# Patient Record
Sex: Male | Born: 1945 | Race: White | Hispanic: No | Marital: Single | Smoking: Never smoker
Health system: Southern US, Community
[De-identification: ages and names within clinical notes are randomized; demographics above are authoritative.]

## PROBLEM LIST (undated history)

## (undated) DIAGNOSIS — K047 Periapical abscess without sinus: Secondary | ICD-10-CM

## (undated) DIAGNOSIS — I4891 Unspecified atrial fibrillation: Secondary | ICD-10-CM

## (undated) DIAGNOSIS — A0472 Enterocolitis due to Clostridium difficile, not specified as recurrent: Secondary | ICD-10-CM

## (undated) DIAGNOSIS — I499 Cardiac arrhythmia, unspecified: Secondary | ICD-10-CM

## (undated) HISTORY — PX: CATARACT EXTRACTION W/ INTRAOCULAR LENS IMPLANT: SHX1309

---

## 1973-01-28 DIAGNOSIS — I499 Cardiac arrhythmia, unspecified: Secondary | ICD-10-CM

## 1973-01-28 HISTORY — DX: Cardiac arrhythmia, unspecified: I49.9

## 2015-08-10 ENCOUNTER — Emergency Department (HOSPITAL_COMMUNITY): Admission: EM | Admit: 2015-08-10 | Discharge: 2015-08-10 | Discharge status: Against Medical Advice | Payer: Self-pay

## 2015-08-11 ENCOUNTER — Ambulatory Visit (INDEPENDENT_AMBULATORY_CARE_PROVIDER_SITE_OTHER): Payer: Self-pay | Admitting: Physician Assistant

## 2015-08-11 VITALS — BP 122/72 | HR 77 | Temp 98.3°F | Resp 17 | Ht 64.5 in | Wt 143.0 lb

## 2015-08-11 DIAGNOSIS — K047 Periapical abscess without sinus: Secondary | ICD-10-CM

## 2015-08-11 MED ORDER — AMOXICILLIN-POT CLAVULANATE 875-125 MG PO TABS: 1.0000 | ORAL_TABLET | Freq: Two times a day (BID) | ORAL | Status: DC

## 2015-08-11 NOTE — Patient Instructions (Addendum)
IF you received an x-ray today, you will receive an invoice from [facility]. Please contact [facility] at [phone] with questions or concerns regarding your invoice.   IF you received labwork today, you will receive an invoice from [facility]. Please contact [facility] at [phone] with questions or concerns regarding your invoice.   Our billing staff will not be able to assist you with questions regarding bills from these companies.  You will be contacted with the lab results as soon as they are available. The fastest way to get your results is to activate your My Chart account. Instructions are located on the last page of this paperwork. If you have not heard from us regarding the results in 2 weeks, please contact this office.   Please contact the [facility] at [phone] for an appointment immediately.  They will advise you how to go about this at their clinic.  Ask for AnethAshley. Please take the antibiotic as prescribed. Use the warm compresses at least 4 times per day for 15 minutes. Dental Abscess A dental abscess is a collection of pus in or around a tooth. CAUSES This condition is caused by a bacterial infection around the root of the tooth that involves the inner part of the tooth (pulp). It may result from:  Severe tooth decay.  Trauma to the tooth that allows bacteria to enter into the pulp, such as a broken or chipped tooth.  Severe gum disease around a tooth. SYMPTOMS Symptoms of this condition include:  Severe pain in and around the infected tooth.  Swelling and redness around the infected tooth, in the mouth, or in the face.  Tenderness.  Pus drainage.  Bad breath.  Bitter taste in the mouth.  Difficulty swallowing.  Difficulty opening the mouth.  Nausea.  Vomiting.  Chills.  Swollen neck glands.  Fever. DIAGNOSIS This condition is diagnosed with examination of the infected tooth. During  the exam, your dentist may tap on the infected tooth. Your dentist will also ask about your medical and dental history and may order X-rays. TREATMENT This condition is treated by eliminating the infection. This may be done with:  Antibiotic medicine.  A root canal. This may be performed to save the tooth.  Pulling (extracting) the tooth. This may also involve draining the abscess. This is done if the tooth cannot be saved. HOME CARE INSTRUCTIONS  Take medicines only as directed by your dentist.  If you were prescribed antibiotic medicine, finish all of it even if you start to feel better.  Rinse your mouth (gargle) often with salt water to relieve pain or swelling.  Do not drive or operate heavy machinery while taking pain medicine.  Do not apply heat to the outside of your mouth.  Keep all follow-up visits as directed by your dentist. This is important. SEEK MEDICAL CARE IF:  Your pain is worse and is not helped by medicine. SEEK IMMEDIATE MEDICAL CARE IF:  You have a fever or chills.  Your symptoms suddenly get worse.  You have a very bad headache.  You have problems breathing or swallowing.  You have trouble opening your mouth.  You have swelling in your neck or around your eye.   This information is not intended to replace advice given to you by your health care provider. Make sure you discuss any questions you have with your health care provider.   Document Released: 01/14/2005 Document Revised: 05/31/2014 Document Reviewed: 01/11/2014 Elsevier Interactive Patient Education  2016 Elsevier Inc.

## 2015-08-11 NOTE — Progress Notes (Signed)
[facility] [address], [place] [id number] [phone]  Date:  08/11/2015   Name:  Brandon CoffinCharles W Ho   DOB:  1945/11/16   MRN:  [id number]  PCP:  Pcp Not In System    History of Present Illness:  Brandon Ho is a 70 y.o. male patient who [facility] cc dental pain and facial swelling.   Few days of left gum swelling and facial swelling.  No fever.  He has had 2 days of fatigue.  No abnormal tasting.  There is no pain with hot or cold drinks.     pcp is jerry davis.  He has no dental insurance, and can not find a dentist opened today.     There are no active problems to display for this patient.   No past medical history on file.  No past surgical history on file.  Social History  Substance Use Topics  . Smoking status: Never Smoker   . Smokeless tobacco: None  . Alcohol Use: None    No family history on file.  No Known Allergies  Medication list has been reviewed and updated.  No current outpatient prescriptions on file prior to visit.   No current facility-administered medications on file prior to visit.    ROS ROS otherwise unremarkable unless listed above.   Physical Examination: BP 122/72 mmHg  Pulse 77  Temp(Src) 98.3 F (36.8 C) (Oral)  Resp 17  Ht 5' 4.5" (1.638 m)  Wt 143 lb (64.864 kg)  BMI 24.18 kg/m2  SpO2 97% Ideal Body Weight: Weight in (lb) to have BMI = 25: 147.6  Physical Exam  Constitutional: He is oriented to person, place, and time. He appears well-developed and well-nourished. No distress.  HENT:  Head: Normocephalic and atraumatic.  Mouth/Throat: Normal dentition. Dental abscesses (at the left side of the upper incisor that extends posteriorly with swelling and tenderness.  no purulent fluid expressed.) present. No oropharyngeal exudate, posterior oropharyngeal edema or posterior oropharyngeal erythema.  Eyes: Conjunctivae and EOM are normal. Pupils are equal, round, and reactive to light.   Cardiovascular: Normal rate.   Pulmonary/Chest: Effort normal. No respiratory distress.  Neurological: He is alert and oriented to person, place, and time.  Skin: Skin is warm and dry. He is not diaphoretic.  Psychiatric: He has a normal mood and affect. His behavior is normal.    Assessment and Plan: Brandon Ho is a 70 y.o. male who is here today for swelling in mouth. Given augmentin. [facility] agreed to discuss pricing and see him today.  Advised alarming symptoms to warrant immediate return.   Abscessed tooth - Plan: DISCONTINUED: amoxicillin-clavulanate (AUGMENTIN) 875-125 MG tablet   Trena PlattStephanie English, PA-C [facility] 08/11/2015 10:15 AM

## 2015-08-21 ENCOUNTER — Ambulatory Visit (INDEPENDENT_AMBULATORY_CARE_PROVIDER_SITE_OTHER): Payer: Self-pay | Admitting: Physician Assistant

## 2015-08-21 VITALS — BP 118/74 | HR 71 | Temp 98.1°F | Ht 64.5 in | Wt 140.0 lb

## 2015-08-21 DIAGNOSIS — R030 Elevated blood-pressure reading, without diagnosis of hypertension: Secondary | ICD-10-CM

## 2015-08-21 DIAGNOSIS — IMO0001 Reserved for inherently not codable concepts without codable children: Secondary | ICD-10-CM

## 2015-08-21 DIAGNOSIS — R5383 Other fatigue: Secondary | ICD-10-CM

## 2015-08-21 DIAGNOSIS — K047 Periapical abscess without sinus: Secondary | ICD-10-CM

## 2015-08-21 MED ORDER — AMOXICILLIN 500 MG PO CAPS: 500.0000 mg | ORAL_CAPSULE | Freq: Three times a day (TID) | ORAL | 0 refills | Status: DC

## 2015-08-21 NOTE — Patient Instructions (Addendum)
Call [facility] and schedule another appt for extraction Continue amoxicillin 3 times a day until seen for extraction. Return here if your symptoms do not improve after procedure. F/u with your PCP    IF you received an x-ray today, you will receive an invoice from [facility]. Please contact [facility] at [phone] with questions or concerns regarding your invoice.   IF you received labwork today, you will receive an invoice from [facility]. Please contact [facility] at [phone] with questions or concerns regarding your invoice.   Our billing staff will not be able to assist you with questions regarding bills from these companies.  You will be contacted with the lab results as soon as they are available. The fastest way to get your results is to activate your My Chart account. Instructions are located on the last page of this paperwork. If you have not heard from [place] regarding the results in 2 weeks, please contact this office.

## 2015-08-21 NOTE — Progress Notes (Signed)
[facility] [address] [place] [address] [phone]  Date:  08/21/2015   Name:  Brandon Ho   DOB:  July 24, 1945   MRN:  [id number]  PCP:  Pcp Not In System    Chief Complaint: Follow-up (for gum abscess)   History of Present Illness:  This is a 70 y.o. male who is presenting for follow up dental abscess on upper left side. He was seen here 10 days ago on 7/14 by PA [place]. He was prescribed augmentin and instructed to contact the [facility] for an appt. He was also advised to use warm compresses several times a day. He was seen at [facility] after his appt here on the 14th. He was given an rx for amoxicillin 500 mg TID x 10 days instead of the augmentin. He took his last tab this AM. He went for an extraction 4 days ago on 7/20. They told him at that visit that his BP was too high for an extraction and that he would need to come back to see a PCP. Pt does have a PCP, Dr. Edmon Crape, but he is semi-retired and is hard to get in with him. He takes propranolol for fast heart rate. He states his BP has always been good. He did take his propranolol before his extraction appt. He reports the past few days he has not felt well. He is very fatigued and does not have much of an appetite. He noticed at one point that he had a sharp pain in his mid back. No coughing but he is worried he has pna.  Talked with supervisor at [employer] On the 20th his BP was 178/98 and then rechecked 15 minutes later and 172/90.   Denies hx CVA or MI.  Review of Systems:  Review of Systems See HPI  There are no active problems to display for this patient.   Prior to Admission medications   Medication Sig Start Date End Date Taking? Authorizing Provider  amoxicillin-clavulanate (AUGMENTIN) 875-125 MG tablet Take 1 tablet by mouth 2 (two) times daily. 08/11/15  Yes Collie Siad English, PA  propranolol (INDERAL) 40 MG tablet Take 40 mg by mouth 3 (three) times  daily.   Yes Historical Provider, MD    No Known Allergies  History reviewed. No pertinent surgical history.  Social History  Substance Use Topics  . Smoking status: Never Smoker  . Smokeless tobacco: Not on file  . Alcohol use Not on file    History reviewed. No pertinent family history.  Medication list has been reviewed and updated.  Physical Examination:  Physical Exam  Constitutional: He is oriented to person, place, and time. He appears well-developed and well-nourished. No distress.  HENT:  Head: Normocephalic and atraumatic.  Right Ear: Hearing, tympanic membrane, external ear and ear canal normal.  Left Ear: Hearing, tympanic membrane, external ear and ear canal normal.  Nose: Nose normal. Right sinus exhibits no maxillary sinus tenderness and no frontal sinus tenderness. Left sinus exhibits no maxillary sinus tenderness and no frontal sinus tenderness.  Mouth/Throat: Uvula is midline, oropharynx is clear and moist and mucous membranes are normal.  Left upper gingiva with broken dentition, superior abscess and mild fluctuance noted. TTP.  Eyes: Conjunctivae and lids are normal. Right eye exhibits no discharge. Left eye exhibits no discharge. No scleral icterus.  Cardiovascular: Normal rate, regular rhythm, normal heart sounds and normal pulses.   No murmur heard. Pulmonary/Chest: Effort normal and  breath sounds normal. No respiratory distress. He has no wheezes. He has no rhonchi. He has no rales.  Musculoskeletal: Normal range of motion.  Lymphadenopathy:       Head (right side): No submental, no submandibular and no tonsillar adenopathy present.       Head (left side): No submental, no submandibular and no tonsillar adenopathy present.    He has no cervical adenopathy.  Neurological: He is alert and oriented to person, place, and time.  Skin: Skin is warm, dry and intact. No lesion and no rash noted.  Psychiatric: He has a normal mood and affect. His speech is  normal and behavior is normal. Thought content normal.   BP 118/74   Pulse 71   Temp 98.1 F (36.7 C) (Oral)   Ht 5' 4.5" (1.638 m)   Wt 140 lb (63.5 kg)   BMI 23.66 kg/m   Assessment and Plan:  1. Dental abscess 2. Other fatigue 3. Elevated BP We have only seen pt twice here but both time BP has been normal. Suspect he was nervous for appt? I have written letter to dentist with his BP readings here. He will call to make another appt for extraction. Amoxcillin TID in the meantime since abscess still present and pt having malaise and fatigue. Return here if symptoms do not improve after extraction. - amoxicillin (AMOXIL) 500 MG capsule; Take 1 capsule (500 mg total) by mouth 3 (three) times daily.  Dispense: 30 capsule; Refill: 0   [place]well Miners. Dyke Brackett, MHS [facility]  08/21/2015

## 2015-08-30 ENCOUNTER — Ambulatory Visit (INDEPENDENT_AMBULATORY_CARE_PROVIDER_SITE_OTHER): Payer: Self-pay | Admitting: Urgent Care

## 2015-08-30 ENCOUNTER — Other Ambulatory Visit: Payer: Self-pay | Admitting: Urgent Care

## 2015-08-30 VITALS — BP 170/98 | HR 81 | Temp 97.5°F | Resp 16 | Ht 65.0 in | Wt 139.2 lb

## 2015-08-30 DIAGNOSIS — K047 Periapical abscess without sinus: Secondary | ICD-10-CM

## 2015-08-30 DIAGNOSIS — R51 Headache: Secondary | ICD-10-CM

## 2015-08-30 DIAGNOSIS — R519 Headache, unspecified: Secondary | ICD-10-CM

## 2015-08-30 DIAGNOSIS — K0889 Other specified disorders of teeth and supporting structures: Secondary | ICD-10-CM

## 2015-08-30 DIAGNOSIS — R030 Elevated blood-pressure reading, without diagnosis of hypertension: Secondary | ICD-10-CM

## 2015-08-30 DIAGNOSIS — R9431 Abnormal electrocardiogram [ECG] [EKG]: Secondary | ICD-10-CM

## 2015-08-30 LAB — BASIC METABOLIC PANEL
BUN: 10 mg/dL (ref 7–25)
CO2: 23 mmol/L (ref 20–31)
Calcium: 9.1 mg/dL (ref 8.6–10.3)
Chloride: 102 mmol/L (ref 98–110)
Creat: 1.31 mg/dL — ABNORMAL HIGH (ref 0.70–1.25)
Glucose, Bld: 111 mg/dL — ABNORMAL HIGH (ref 65–99)
POTASSIUM: 4.5 mmol/L (ref 3.5–5.3)
SODIUM: 136 mmol/L (ref 135–146)

## 2015-08-30 LAB — POCT CBC
GRANULOCYTE PERCENT: 67 % (ref 37–80)
HCT, POC: 40.9 % — AB (ref 43.5–53.7)
HEMOGLOBIN: 14.4 g/dL (ref 14.1–18.1)
Lymph, poc: 2.4 (ref 0.6–3.4)
MCH: 30.9 pg (ref 27–31.2)
MCHC: 35.2 g/dL (ref 31.8–35.4)
MCV: 87.7 fL (ref 80–97)
MID (CBC): 0.8 (ref 0–0.9)
MPV: 7.1 fL (ref 0–99.8)
PLATELET COUNT, POC: 200 10*3/uL (ref 142–424)
POC Granulocyte: 6.5 (ref 2–6.9)
POC LYMPH PERCENT: 24.8 %L (ref 10–50)
POC MID %: 8.2 %M (ref 0–12)
RBC: 4.66 M/uL — AB (ref 4.69–6.13)
RDW, POC: 13.1 %
WBC: 9.7 10*3/uL (ref 4.6–10.2)

## 2015-08-30 MED ORDER — HYDROCHLOROTHIAZIDE 12.5 MG PO TABS: 12.5000 mg | ORAL_TABLET | Freq: Every day | ORAL | 3 refills | Status: DC

## 2015-08-30 MED ORDER — CHLORHEXIDINE GLUCONATE 0.12 % MT SOLN: 15.0000 mL | Freq: Two times a day (BID) | OROMUCOSAL | 1 refills | Status: DC

## 2015-08-30 MED ORDER — AMOXICILLIN 500 MG PO CAPS: 500.0000 mg | ORAL_CAPSULE | Freq: Three times a day (TID) | ORAL | 0 refills | Status: DC

## 2015-08-30 NOTE — Patient Instructions (Addendum)
- For your headache, you may try Tylenol extra strength  every 6 hours. Please make sure you finish your antibiotics for your tooth infection. Check back with your dentist to make sure your abscess is resolved. In the meantime, use the oral mouth rinses together with your oral antibiotic. Start the low dose blood pressure medication. Hydrate very well with at least 2 liters of water daily.     Tension Headache A tension headache is a feeling of pain, pressure, or aching that is often felt over the front and sides of the head. The pain can be dull, or it can feel tight (constricting). Tension headaches are not normally associated with nausea or vomiting, and they do not get worse with physical activity. Tension headaches can last from 30 minutes to several days. This is the most common type of headache. CAUSES The exact cause of this condition is not known. Tension headaches often begin after stress, anxiety, or depression. Other triggers may include:  Alcohol.  Too much caffeine, or caffeine withdrawal.  Respiratory infections, such as colds, flu, or sinus infections.  Dental problems or teeth clenching.  Fatigue.  Holding your head and neck in the same position for a long period of time, such as while using a computer.  Smoking. SYMPTOMS Symptoms of this condition include:  A feeling of pressure around the head.  Dull, aching head pain.  Pain felt over the front and sides of the head.  Tenderness in the muscles of the head, neck, and shoulders. DIAGNOSIS This condition may be diagnosed based on your symptoms and a physical exam. Tests may be done, such as a CT scan or an MRI of your head. These tests may be done if your symptoms are severe or unusual. TREATMENT This condition may be treated with lifestyle changes and medicines to help relieve symptoms. HOME CARE INSTRUCTIONS Managing Pain  Take over-the-counter and prescription medicines only as told by your health care  provider.  Lie down in a dark, quiet room when you have a headache.  If directed, apply ice to the head and neck area:  Put ice in a plastic bag.  Place a towel between your skin and the bag.  Leave the ice on for 20 minutes, 2-3 times per day.  Use a heating pad or a hot shower to apply heat to the head and neck area as told by your health care provider. Eating and Drinking  Eat meals on a regular schedule.  Limit alcohol use.  Decrease your caffeine intake, or stop using caffeine. General Instructions  Keep all follow-up visits as told by your health care provider. This is important.  Keep a headache journal to help find out what may trigger your headaches. For example, write down:  What you eat and drink.  How much sleep you get.  Any change to your diet or medicines.  Try massage or other relaxation techniques.  Limit stress.  Sit up straight, and avoid tensing your muscles.  Do not use tobacco products, including cigarettes, chewing tobacco, or e-cigarettes. If you need help quitting, ask your health care provider.  Exercise regularly as told by your health care provider.  Get 7-9 hours of sleep, or the amount recommended by your health care provider. SEEK MEDICAL CARE IF:  Your symptoms are not helped by medicine.  You have a headache that is different from what you normally experience.  You have nausea or you vomit.  You have a fever. SEEK IMMEDIATE MEDICAL CARE IF:  Your headache becomes severe.  You have repeated vomiting.  You have a stiff neck.  You have a loss of vision.  You have problems with speech.  You have pain in your eye or ear.  You have muscular weakness or loss of muscle control.  You lose your balance or you have trouble walking.  You feel faint or you pass out.  You have confusion.   This information is not intended to replace advice given to you by your health care provider. Make sure you discuss any questions you  have with your health care provider.   Document Released: 01/14/2005 Document Revised: 10/05/2014 Document Reviewed: 05/09/2014 Elsevier Interactive Patient Education 2016 [employer].    IF you received an x-ray today, you will receive an invoice from [facility]. Please contact [facility] at [phone] with questions or concerns regarding your invoice.   IF you received labwork today, you will receive an invoice from [facility]. Please contact [facility] at [phone] with questions or concerns regarding your invoice.   Our billing staff will not be able to assist you with questions regarding bills from these companies.  You will be contacted with the lab results as soon as they are available. The fastest way to get your results is to activate your My Chart account. Instructions are located on the last page of this paperwork. If you have not heard from [place] regarding the results in 2 weeks, please contact this office.

## 2015-08-30 NOTE — Progress Notes (Signed)
MRN: [id number] DOB: 12-Jan-1946  Subjective:   Brandon Ho is a 70 y.o. male presenting for chief complaint of Headache  Reports 5 day history of intermittent headache. Has been frontal at times, also temporal/occipital headache, achy in nature, associated with tingling of his scalp, heart fluttering. Denies fever, blurred vision, confusion, chest pain, shob, abdominal pain, hematuria, lower leg swelling. Of note, had a tooth extraction on 08/22/2015, treated for tooth abscess, has been on amoxicillin for same 08/11/2015. He was initially sent back here prior to his tooth extraction for a recheck of his BP since it was elevated while at the dentist. His primary concern is ongoing tooth infection. He reports that he stopped taking amoxicillin yesterday because he was unsure if he should take it due to his headache. Continues to try and hydrate. Denies smoking cigarettes or drinking alcohol.   Brandon Ho has a current medication list which includes the following prescription(s): amoxicillin and propranolol. Also has No Known Allergies.  Brandon Ho  has no past medical history on file. Also  has no past surgical history on file.  Objective:   Vitals: BP (!) 170/98 (BP Location: Right Arm, Patient Position: Sitting, Cuff Size: Normal)   Pulse 81   Temp 97.5 F (36.4 C) (Oral)   Resp 16   Ht  (1.651 m)   Wt 139 lb 3.2 oz (63.1 kg)   SpO2 99%   BMI 23.16 kg/m   Physical Exam  Constitutional: He is oriented to person, place, and time. He appears well-developed and well-nourished.  HENT:  Tooth extraction of upper left jaw observed. There is erythema of surrounding tissue with associated tenderness but no drainage was expressed. He also has mild tenderness of his upper left face.  Eyes: No scleral icterus.  Cardiovascular: Normal rate, regular rhythm and intact distal pulses.  Exam reveals no gallop and no friction rub.   No murmur heard. Pulmonary/Chest: No respiratory distress. He  has no wheezes. He has no rales.  Musculoskeletal: He exhibits no edema.  Lymphadenopathy:    He has no cervical adenopathy.  Neurological: He is alert and oriented to person, place, and time.   Results for orders placed or performed in visit on 08/30/15 (from the past 24 hour(s))  POCT CBC     Status: Abnormal   Collection Time: 08/30/15 10:15 AM  Result Value Ref Range   WBC 9.7 4.6 - 10.2 K/uL   Lymph, poc 2.4 0.6 - 3.4   POC LYMPH PERCENT 24.8 10 - 50 %L   MID (cbc) 0.8 0 - 0.9   POC MID % 8.2 0 - 12 %M   POC Granulocyte 6.5 2 - 6.9   Granulocyte percent 67.0 37 - 80 %G   RBC 4.66 (A) 4.69 - 6.13 M/uL   Hemoglobin 14.4 14.1 - 18.1 g/dL   HCT, POC 30.8 (A) 65.7 - 53.7 %   MCV 87.7 80 - 97 fL   MCH, POC 30.9 27 - 31.2 pg   MCHC 35.2 31.8 - 35.4 g/dL   RDW, POC 84.6 %   Platelet Count, POC 200 142 - 424 K/uL   MPV 7.1 0 - 99.8 fL   ECG - Spiked T-waves in V3, V4. Otherwise in sinus rhythm.  Assessment and Plan :   1. Headache, unspecified headache type 2. Elevated blood pressure reading - Suspect tension headache, may be related to his blood pressure. However, this is only the first reading we have at our clinic. I advised  he use Inderal as he has in the past, start HCTZ low dose. Recheck in 4 weeks.  3. Tooth abscess 4. Tooth pain - Restart antibiotic, use chlorhexadine mouth rinse, check back with dentist.  5. Nonspecific abnormal electrocardiogram (ECG) (EKG) - BMet pending. Will monitor, counseled on symptoms of hyperkalemia. I do not suspect this is a source of his symptoms.  Brandon Bamberg, PA-C [facility] [phone] 08/30/2015 9:45 AM

## 2015-08-31 ENCOUNTER — Ambulatory Visit (INDEPENDENT_AMBULATORY_CARE_PROVIDER_SITE_OTHER): Payer: Self-pay | Admitting: Emergency Medicine

## 2015-08-31 ENCOUNTER — Telehealth: Payer: Self-pay

## 2015-08-31 ENCOUNTER — Ambulatory Visit (INDEPENDENT_AMBULATORY_CARE_PROVIDER_SITE_OTHER): Payer: Self-pay

## 2015-08-31 VITALS — BP 174/82 | HR 78 | Temp 98.0°F | Resp 18 | Ht 65.0 in | Wt 139.0 lb

## 2015-08-31 DIAGNOSIS — R51 Headache: Secondary | ICD-10-CM

## 2015-08-31 DIAGNOSIS — K047 Periapical abscess without sinus: Secondary | ICD-10-CM

## 2015-08-31 DIAGNOSIS — R110 Nausea: Secondary | ICD-10-CM

## 2015-08-31 DIAGNOSIS — I10 Essential (primary) hypertension: Secondary | ICD-10-CM

## 2015-08-31 DIAGNOSIS — R519 Headache, unspecified: Secondary | ICD-10-CM

## 2015-08-31 LAB — POCT CBC
GRANULOCYTE PERCENT: 68.7 % (ref 37–80)
HEMATOCRIT: 41.5 % — AB (ref 43.5–53.7)
Hemoglobin: 14.3 g/dL (ref 14.1–18.1)
LYMPH, POC: 2.2 (ref 0.6–3.4)
MCH, POC: 30.4 pg (ref 27–31.2)
MCHC: 34.4 g/dL (ref 31.8–35.4)
MCV: 88.3 fL (ref 80–97)
MID (CBC): 0.9 (ref 0–0.9)
MPV: 6.8 fL (ref 0–99.8)
PLATELET COUNT, POC: 214 10*3/uL (ref 142–424)
POC GRANULOCYTE: 6.7 (ref 2–6.9)
POC LYMPH %: 22.6 % (ref 10–50)
POC MID %: 8.7 %M (ref 0–12)
RBC: 4.7 M/uL (ref 4.69–6.13)
RDW, POC: 13.4 %
WBC: 9.8 10*3/uL (ref 4.6–10.2)

## 2015-08-31 LAB — POCT SEDIMENTATION RATE: POCT SED RATE: 26 mm/h — AB (ref 0–22)

## 2015-08-31 LAB — TROPONIN I: TROPONIN I: 0.01 ng/mL (ref <=0.05)

## 2015-08-31 MED ORDER — ONDANSETRON 4 MG PO TBDP: 4.0000 mg | ORAL_TABLET | Freq: Three times a day (TID) | ORAL | 0 refills | Status: DC | PRN

## 2015-08-31 MED ORDER — ONDANSETRON 4 MG PO TBDP
4.0000 mg | ORAL_TABLET | ORAL | Status: AC
  Administered 2015-08-31: 4 mg via ORAL

## 2015-08-31 NOTE — Patient Instructions (Addendum)
Please eat a soft diet and be sure you get in fluids. I sent in a prescription for Zofran to have for nausea. Decrease your amoxicillin  To  twice a day with food Nausea, Adult Nausea is the feeling that you have an upset stomach or have to vomit. Nausea by itself is not likely a serious concern, but it may be an early sign of more serious medical problems. As nausea gets worse, it can lead to vomiting. If vomiting develops, there is the risk of dehydration.  CAUSES   Viral infections.  Food poisoning.  Medicines.  Pregnancy.  Motion sickness.  Migraine headaches.  Emotional distress.  Severe pain from any source.  Alcohol intoxication. HOME CARE INSTRUCTIONS  Get plenty of rest.  Ask your caregiver about specific rehydration instructions.  Eat small amounts of food and sip liquids more often.  Take all medicines as told by your caregiver. SEEK MEDICAL CARE IF:  You have not improved after 2 days, or you get worse.  You have a headache. SEEK IMMEDIATE MEDICAL CARE IF:   You have a fever.  You faint.  You keep vomiting or have blood in your vomit.  You are extremely weak or dehydrated.  You have dark or bloody stools.  You have severe chest or abdominal pain. MAKE SURE YOU:  Understand these instructions.  Will watch your condition.  Will get help right away if you are not doing well or get worse.   This information is not intended to replace advice given to you by your health care provider. Make sure you discuss any questions you have with your health care provider.   Document Released: 02/22/2004 Document Revised: 02/04/2014 Document Reviewed: 09/26/2010 Elsevier Interactive Patient Education [employer] . Plan to see me on Saturday afternoon.    IF you received an x-ray today, you will receive an invoice from [facility]. Please contact [facility] at [phone] with questions or concerns regarding your invoice.   IF  you received labwork today, you will receive an invoice from [employer]. Please contact [facility] at [phone] with questions or concerns regarding your invoice.   Our billing staff will not be able to assist you with questions regarding bills from these companies.  You will be contacted with the lab results as soon as they are available. The fastest way to get your results is to activate your My Chart account. Instructions are located on the last page of this paperwork. If you have not heard from [place] regarding the results in 2 weeks, please contact this office.

## 2015-08-31 NOTE — Progress Notes (Addendum)
Patient ID: Brandon Ho, male   DOB: Jan 27, 1946, 70 y.o.   MRN: [id number]    By signing my name below I, Shelah Lewandowsky, attest that this documentation has been prepared under the direction and in the presence of [name], MD. Electonically Signed. Shelah Lewandowsky, Scribe 08/31/2015 at 10:56 AM  Chief Complaint:  Chief Complaint  Patient presents with  . Follow-up    TOOTH REMOVAL/ POSS INFECTION IN GUM/PATIENT CAN'T EAT  . Headache    BACK OF HEAD/ HAVING TROUBLE GETTING FOOD DOWN    HPI: Brandon Ho is a 70 y.o. male who reports to [facility] today complaining of worsening nausea for the past 4 days that has been so severe in the past 2 days he has not been able to eat any food for the past 2 days. Pt has been drinking plenty of fluids until last night and this morning. Pt also reports worsening chills, diaphoresis in extremities, weakness, and posterior HA for the past 6 days. HA is a 5/10 pain. Pt took tylenol last night and pt's nausea worsened. Pt denies any abd pain or diarrhea. Pt reports having an episode of chest tightness last night. Pt has had mild SOB. Pt denies any CP, even with exertion.   Pt reports that he was seen and evaluated at [facility] yesterday and had blood work and an EKG.  Pt reports that he had a tooth abscess. Pt was put on amoxicillin and had his tooth removed 9 days ago.  Takes inderal since 76 for fast irregular heartbeat that is well controlled with medication.     History reviewed. No pertinent past medical history. History reviewed. No pertinent surgical history. Social History   Social History  . Marital status: Single    Spouse name: N/A  . Number of children: N/A  . Years of education: N/A   Social History Main Topics  . Smoking status: Never Smoker  . Smokeless tobacco: Never Used  . Alcohol use No  . Drug use: No  . Sexual activity: Not Asked   Other Topics Concern  . None   Social History Narrative  . None   History reviewed. No  pertinent family history. No Known Allergies Prior to Admission medications   Medication Sig Start Date End Date Taking? Authorizing Provider  amoxicillin (AMOXIL) 500 MG capsule Take 1 capsule (500 mg total) by mouth 3 (three) times daily with meals. 08/30/15  Yes Wallis Bamberg, PA-C  chlorhexidine (PERIDEX) 0.12 % solution Use as directed 15 mLs in the mouth or throat 2 (two) times daily. 08/30/15  Yes Wallis Bamberg, PA-C  hydrochlorothiazide (HYDRODIURIL) 12.5 MG tablet Take 1 tablet (12.5 mg total) by mouth daily. 08/30/15  Yes Wallis Bamberg, PA-C  propranolol (INDERAL) 40 MG tablet Take 40 mg by mouth 3 (three) times daily.   Yes Historical Provider, MD     ROS: The patient denies unintentional weight loss, palpitations, wheezing, dyspnea on exertion, diarrhea, vomiting, abdominal pain, dysuria, hematuria, melena, numbness, or tingling. Pt is positive for chest tightness, chills, nausea, weakness, and HA.  All other systems have been reviewed and were otherwise negative with the exception of those mentioned in the HPI and as above.    PHYSICAL EXAM: Vitals:   08/31/15 0955  BP: 130/82  Pulse: 78  Resp: 18  Temp: 98 F (36.7 C)   Body mass index is 23.13 kg/m.   General: Alert, no acute distress HEENT:  Normocephalic, atraumatic, oropharynx patent. Pt has a dental [place]  on his left lower second premolar and a recent extraction of the left upper premolar. Mild tenderness at extraction site. No drainage.  Eye: Nonie Hoyer [facility] Cardiovascular:  Regular rate and rhythm, no rubs murmurs or gallops.  No Carotid bruits, radial pulse intact. No pedal edema. Pt has a loud second heart sound.  Respiratory: Clear to auscultation bilaterally.  No wheezes, rales, or rhonchi.  No cyanosis, no use of accessory musculature Abdominal: No organomegaly, abdomen is soft and non-tender, positive bowel sounds.  No masses. Musculoskeletal: Gait intact. No edema, tenderness Skin: No rashes. Neurologic: Facial  musculature symmetric. Psychiatric: Patient acts appropriately throughout our interaction. Lymphatic: No cervical or submandibular lymphadenopathy  Wt Readings from Last 3 Encounters:  08/31/15 139 lb (63 kg)  08/30/15 139 lb 3.2 oz (63.1 kg)  08/21/15 140 lb (63.5 kg)     LABS:  White count yesterday was within normal limits.   Results for orders placed or performed in visit on 08/31/15  POCT CBC  Result Value Ref Range   WBC 9.8 4.6 - 10.2 K/uL   Lymph, poc 2.2 0.6 - 3.4   POC LYMPH PERCENT 22.6 10 - 50 %L   MID (cbc) 0.9 0 - 0.9   POC MID % 8.7 0 - 12 %M   POC Granulocyte 6.7 2 - 6.9   Granulocyte percent 68.7 37 - 80 %G   RBC 4.70 4.69 - 6.13 M/uL   Hemoglobin 14.3 14.1 - 18.1 g/dL   HCT, POC 16.1 (A) 09.6 - 53.7 %   MCV 88.3 80 - 97 fL   MCH, POC 30.4 27 - 31.2 pg   MCHC 34.4 31.8 - 35.4 g/dL   RDW, POC 04.5 %   Platelet Count, POC 214 142 - 424 K/uL   MPV 6.8 0 - 99.8 fL     EKG/XRAY:   Primary read interpreted by Dr. Cleta Alberts at [facility].  EKG from yesterday viewed and interpreted by Dr Cleta Alberts and shows no acute changes.   EKG today shows more artifact than yesterday, no acute changes.   Dg Chest 2 View  Result Date: 08/31/2015 CLINICAL DATA:  Worsening nausea for the past 4 days with increasing severity over the past 2 days. No oral ingestion for 2 days. EXAM: CHEST  2 VIEW COMPARISON:  None in PACs FINDINGS: The lungs are mildly hyperinflated with hemidiaphragm flattening and increased AP dimension of the thorax. There is an azygos lobe anatomy on the right. There is no alveolar pneumonia. The heart and pulmonary vascularity are normal. There is calcification in the wall of the aortic arch. There is no pleural effusion or pneumothorax. The bony thorax exhibits no acute abnormality. The gas pattern in the upper abdomen is unremarkable. IMPRESSION: COPD.  No pneumonia nor CHF. Aortic atherosclerosis. Electronically Signed   By: David  [place] M.D.   On: 08/31/2015 10:34    Results for orders placed or performed in visit on 08/31/15  Troponin I  Result Value Ref Range   Troponin I 0.01 <=0.05 ng/mL  POCT CBC  Result Value Ref Range   WBC 9.8 4.6 - 10.2 K/uL   Lymph, poc 2.2 0.6 - 3.4   POC LYMPH PERCENT 22.6 10 - 50 %L   MID (cbc) 0.9 0 - 0.9   POC MID % 8.7 0 - 12 %M   POC Granulocyte 6.7 2 - 6.9   Granulocyte percent 68.7 37 - 80 %G   RBC 4.70 4.69 - 6.13 M/uL   Hemoglobin 14.3 14.1 -  18.1 g/dL   HCT, POC 82.8 (A) 00.3 - 53.7 %   MCV 88.3 80 - 97 fL   MCH, POC 30.4 27 - 31.2 pg   MCHC 34.4 31.8 - 35.4 g/dL   RDW, POC 49.1 %   Platelet Count, POC 214 142 - 424 K/uL   MPV 6.8 0 - 99.8 fL  POCT SEDIMENTATION RATE  Result Value Ref Range   POCT SED RATE 26 (A) 0 - 22 mm/hr    ASSESSMENT/PLAN:  It is not clear to me what is going on. He has been treated for dental  Infection and abscess. He has a persistent carious tooth in the lower jaw. He has complaints of nausea but no true chest pain. His EKG is unremarkable and troponin was ordered. His chest x-ray shows no pneumonia. We'll see about getting him a liter of fluids and some Zofran and see if that makes him feel better.atient has an appointment to follow-up with the dentist on Monday. I will also check on Saturday  if he is not  Starting to feel better.   Gross sideeffects, risk and benefits, and alternatives of medications d/w patient. Patient is aware that all medications have potential sideeffects and we are unable to predict every sideeffect or drug-drug interaction that may occur.  [name] MD 08/31/2015 10:00 AM

## 2015-08-31 NOTE — Telephone Encounter (Signed)
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 As he will pay without insurance  [phone]

## 2015-09-02 ENCOUNTER — Ambulatory Visit (INDEPENDENT_AMBULATORY_CARE_PROVIDER_SITE_OTHER): Payer: Self-pay

## 2015-09-02 ENCOUNTER — Ambulatory Visit (HOSPITAL_COMMUNITY)
Admission: RE | Admit: 2015-09-02 | Discharge: 2015-09-02 | Disposition: A | Discharge status: Home / Self Care | Payer: Self-pay | Source: Ambulatory Visit | Attending: Emergency Medicine | Admitting: Emergency Medicine

## 2015-09-02 ENCOUNTER — Ambulatory Visit (INDEPENDENT_AMBULATORY_CARE_PROVIDER_SITE_OTHER): Payer: Self-pay | Admitting: Emergency Medicine

## 2015-09-02 VITALS — BP 132/86 | HR 69 | Temp 98.2°F | Resp 16 | Ht 65.0 in | Wt 139.0 lb

## 2015-09-02 DIAGNOSIS — K0889 Other specified disorders of teeth and supporting structures: Secondary | ICD-10-CM

## 2015-09-02 DIAGNOSIS — R930 Abnormal findings on diagnostic imaging of skull and head, not elsewhere classified: Secondary | ICD-10-CM

## 2015-09-02 DIAGNOSIS — M542 Cervicalgia: Secondary | ICD-10-CM

## 2015-09-02 DIAGNOSIS — R519 Headache, unspecified: Secondary | ICD-10-CM

## 2015-09-02 DIAGNOSIS — I10 Essential (primary) hypertension: Secondary | ICD-10-CM

## 2015-09-02 DIAGNOSIS — R55 Syncope and collapse: Secondary | ICD-10-CM

## 2015-09-02 DIAGNOSIS — R51 Headache: Secondary | ICD-10-CM

## 2015-09-02 MED ORDER — ONDANSETRON 4 MG PO TBDP: 4.0000 mg | ORAL_TABLET | Freq: Three times a day (TID) | ORAL | 0 refills | Status: DC | PRN

## 2015-09-02 NOTE — Progress Notes (Signed)
By signing my name below, I, Brandon Ho, attest that this documentation has been prepared under the direction and in the presence of Brandon Chris, MD. Electronically Signed: Stann Ho, Scribe. 09/02/2015 , 10:45 AM .  Patient was seen in room 10 .  Chief Complaint:  Chief Complaint  Patient presents with  . Follow-up    nausea seen yesterday     HPI: Brandon Ho is a 70 y.o. male who reports to [facility] today for follow up nausea. Patient's sed rate was 26, wbc normal, troponin negative; BMP showed slightly elevated glucose at 111. Xray showed atherosclerosis; EKG showed sinus rhythm, no acute changes.   Patient is still having headaches but he has been feeling better intermittently but it doesn't last. He has his headache from when he wakes up, located from the top down to the middle. He felt extremely fatigue last night and this morning.   He mentions neck pain after losing his balance 2 days ago. He returned home after being seen here. He took a nap, and when he woke up, he was trying to put on his trousers but lost balance with slight syncope, falling to the floor hitting his left arm, left shoulder and the back of his left leg.   He also reports abdominal pain with loose stool once yesterday. He notes eating some bread prior yesterday.  He has eye surgery history done by Dr. Dione Ho.   He's been instructed to take his inderal tid, but he notes only been taking it bid.   He also states "possibly misusing mouth wash". He didn't rinse his mouth out with water after swishing the mouth wash, and his tongue continues to feel "tingly" with some burning in the back of his throat.   No past medical history on file. No past surgical history on file. Social History   Social History  . Marital status: Single    Spouse name: N/A  . Number of children: N/A  . Years of education: N/A   Social History Main Topics  . Smoking status: Never Smoker  . Smokeless tobacco: Never Used    . Alcohol use No  . Drug use: No  . Sexual activity: Not Asked   Other Topics Concern  . None   Social History Narrative  . None   No family history on file. No Known Allergies Prior to Admission medications   Medication Sig Start Date End Date Taking? Authorizing Provider  amoxicillin (AMOXIL) 500 MG capsule Take 1 capsule (500 mg total) by mouth 3 (three) times daily with meals. 08/30/15  Yes Brandon Bamberg, PA-C  chlorhexidine (PERIDEX) 0.12 % solution Use as directed 15 mLs in the mouth or throat 2 (two) times daily. 08/30/15  Yes Brandon Bamberg, PA-C  hydrochlorothiazide (HYDRODIURIL) 12.5 MG tablet Take 1 tablet (12.5 mg total) by mouth daily. 08/30/15  Yes Brandon Bamberg, PA-C  ondansetron (ZOFRAN ODT) 4 MG disintegrating tablet Take 1 tablet (4 mg total) by mouth every 8 (eight) hours as needed for nausea or vomiting. 08/31/15  Yes Brandon Gobble, MD  propranolol (INDERAL) 40 MG tablet Take 40 mg by mouth 3 (three) times daily.   Yes Historical Provider, MD     ROS:  Constitutional: negative for fever, chills, night sweats, weight changes, or fatigue  HEENT: negative for vision changes, hearing loss, congestion, rhinorrhea, ST, epistaxis, or sinus pressure Cardiovascular: negative for chest pain or palpitations Respiratory: negative for hemoptysis, wheezing, shortness of breath, or cough Abdominal: negative  for nausea, vomiting, diarrhea, or constipation; positive for abdominal pain, loose stools Dermatological: negative for rash Musc: positive for neck pain Neurologic: positive for headache, dizziness, syncope All other systems reviewed and are otherwise negative with the exception to those above and in the HPI.  PHYSICAL EXAM: Vitals:   09/02/15 1001  BP: 132/86  Pulse: 69  Resp: 16  Temp: 98.2 F (36.8 C)   Body mass index is 23.13 kg/m.   General: Alert, no acute distress HEENT:  Normocephalic, atraumatic, oropharynx patent. Eye: EOMI, PEERLDC; Bilateral cataract  surgery Cardiovascular:  Regular rate and rhythm, no rubs murmurs or gallops.  No Carotid bruits, radial pulse intact. No pedal edema.  Respiratory: Clear to auscultation bilaterally.  No wheezes, rales, or rhonchi.  No cyanosis, no use of accessory musculature Abdominal: No organomegaly, positive bowel sounds. no areas of tenderness, no masses Musculoskeletal: Gait intact. No edema, tenderness Skin: No rashes. Neurologic: Facial musculature symmetric. Psychiatric: Patient acts appropriately throughout our interaction.  Lymphatic: No cervical or submandibular lymphadenopathy Genitourinary/Anorectal: No acute findings  LABS:   EKG/XRAY:   Dg Chest 2 View  Result Date: 08/31/2015 CLINICAL DATA:  Worsening nausea for the past 4 days with increasing severity over the past 2 days. No oral ingestion for 2 days. EXAM: CHEST  2 VIEW COMPARISON:  None in PACs FINDINGS: The lungs are mildly hyperinflated with hemidiaphragm flattening and increased AP dimension of the thorax. There is an azygos lobe anatomy on the right. There is no alveolar pneumonia. The heart and pulmonary vascularity are normal. There is calcification in the wall of the aortic arch. There is no pleural effusion or pneumothorax. The bony thorax exhibits no acute abnormality. The gas pattern in the upper abdomen is unremarkable. IMPRESSION: COPD.  No pneumonia nor CHF. Aortic atherosclerosis. Electronically Signed   By: David  [place] M.D.   On: 08/31/2015 10:34   Dg Cervical Spine Complete  Result Date: 09/02/2015 CLINICAL DATA:  Head and neck pain.  Fall 2 days ago. EXAM: CERVICAL SPINE - COMPLETE 4+ VIEW COMPARISON:  None. FINDINGS: The pre odontoid space and prevertebral soft tissues are normal. No traumatic malalignment identified. No fractures are noted. Scattered degenerative changes. No significant neural foraminal narrowing. The lateral masses of C1 are not well assessed versus C2. Very limited views of the odontoid process. The  upper chest is unremarkable. IMPRESSION: 1. Limited views of the odontoid process. No fracture or malalignment seen within this limitation. Electronically Signed   By: Brandon Ho M.D   On: 09/02/2015 11:40    ASSESSMENT/PLAN: Still not clear to me what is going on. Troponin yesterday was negative. His sugar is only slightly elevated. He has minimal renal disease. C-spine shows significant arthritic change. We'll proceed wi with his headache and persistent nausea.I will have him stop his anitbiotic and see the dentist on Monday.no fractures or malalignment were seen on C-spine films though the odontoid i was poorly seen.we'll proceed with CT of the head. We'll also stop his antibiotics and continue Zofran for now.I personally performed the services described in this documentation, which was scribed in my presence. The recorded information has been reviewed and is accurate. CT of the head showed only small vessel disease no acute infarct or acute changes.he is going to stop his peridex. He will stop his antibiotics. He will follow-up with the dentist on Monday. He will continue his blood pressure medication. Gross sideeffects, risk and benefits, and alternatives of medications d/w patient. Patient is aware that  all medications have potential sideeffects and we are unable to predict every sideeffect or drug-drug interaction that may occur.  Brandon Chris MD 09/02/2015 10:06 AM

## 2015-09-02 NOTE — Patient Instructions (Addendum)
You are to go over to [facility] ER now for your scan.  Let them know that you are there for an outpatient CT scan.  Address: [address] [place], [place], [place] [address].  [phone]     IF you received an x-ray today, you will receive an invoice from [facility]. Please contact [facility] at [phone] with questions or concerns regarding your invoice.   IF you received labwork today, you will receive an invoice from [employer]. Please contact [facility] at [phone] with questions or concerns regarding your invoice.   Our billing staff will not be able to assist you with questions regarding bills from these companies.  You will be contacted with the lab results as soon as they are available. The fastest way to get your results is to activate your My Chart account. Instructions are located on the last page of this paperwork. If you have not heard from [place] regarding the results in 2 weeks, please contact this office.

## 2015-09-04 ENCOUNTER — Telehealth: Payer: Self-pay | Admitting: Emergency Medicine

## 2015-09-04 ENCOUNTER — Encounter (HOSPITAL_COMMUNITY): Payer: Self-pay | Admitting: Emergency Medicine

## 2015-09-04 ENCOUNTER — Inpatient Hospital Stay (HOSPITAL_COMMUNITY)
Admission: EM | Admit: 2015-09-04 | Discharge: 2015-09-07 | DRG: 640 | Disposition: A | Discharge status: Home / Self Care | Payer: Medicare Other | Attending: Internal Medicine | Admitting: Internal Medicine

## 2015-09-04 ENCOUNTER — Telehealth: Payer: Self-pay

## 2015-09-04 DIAGNOSIS — R338 Other retention of urine: Secondary | ICD-10-CM | POA: Diagnosis present

## 2015-09-04 DIAGNOSIS — E860 Dehydration: Secondary | ICD-10-CM | POA: Diagnosis present

## 2015-09-04 DIAGNOSIS — Z823 Family history of stroke: Secondary | ICD-10-CM

## 2015-09-04 DIAGNOSIS — E43 Unspecified severe protein-calorie malnutrition: Secondary | ICD-10-CM | POA: Diagnosis present

## 2015-09-04 DIAGNOSIS — R531 Weakness: Secondary | ICD-10-CM | POA: Diagnosis not present

## 2015-09-04 DIAGNOSIS — E871 Hypo-osmolality and hyponatremia: Secondary | ICD-10-CM | POA: Diagnosis not present

## 2015-09-04 DIAGNOSIS — E876 Hypokalemia: Secondary | ICD-10-CM | POA: Diagnosis present

## 2015-09-04 DIAGNOSIS — G319 Degenerative disease of nervous system, unspecified: Secondary | ICD-10-CM | POA: Diagnosis present

## 2015-09-04 DIAGNOSIS — K047 Periapical abscess without sinus: Secondary | ICD-10-CM | POA: Diagnosis present

## 2015-09-04 DIAGNOSIS — R110 Nausea: Secondary | ICD-10-CM

## 2015-09-04 DIAGNOSIS — R930 Abnormal findings on diagnostic imaging of skull and head, not elsewhere classified: Secondary | ICD-10-CM | POA: Diagnosis present

## 2015-09-04 DIAGNOSIS — K5900 Constipation, unspecified: Secondary | ICD-10-CM | POA: Diagnosis present

## 2015-09-04 DIAGNOSIS — N400 Enlarged prostate without lower urinary tract symptoms: Secondary | ICD-10-CM | POA: Diagnosis present

## 2015-09-04 DIAGNOSIS — Z8249 Family history of ischemic heart disease and other diseases of the circulatory system: Secondary | ICD-10-CM

## 2015-09-04 DIAGNOSIS — R51 Headache: Secondary | ICD-10-CM

## 2015-09-04 DIAGNOSIS — Z6823 Body mass index (BMI) 23.0-23.9, adult: Secondary | ICD-10-CM

## 2015-09-04 DIAGNOSIS — I10 Essential (primary) hypertension: Secondary | ICD-10-CM | POA: Diagnosis present

## 2015-09-04 DIAGNOSIS — N401 Enlarged prostate with lower urinary tract symptoms: Secondary | ICD-10-CM | POA: Diagnosis present

## 2015-09-04 DIAGNOSIS — R519 Headache, unspecified: Secondary | ICD-10-CM

## 2015-09-04 HISTORY — DX: Cardiac arrhythmia, unspecified: I49.9

## 2015-09-04 HISTORY — DX: Periapical abscess without sinus: K04.7

## 2015-09-04 LAB — CBC
HCT: 35.2 % — ABNORMAL LOW (ref 39.0–52.0)
Hemoglobin: 13.5 g/dL (ref 13.0–17.0)
MCH: 30.5 pg (ref 26.0–34.0)
MCHC: 38.4 g/dL — ABNORMAL HIGH (ref 30.0–36.0)
MCV: 79.6 fL (ref 78.0–100.0)
Platelets: 189 10*3/uL (ref 150–400)
RBC: 4.42 MIL/uL (ref 4.22–5.81)
RDW: 12.8 % (ref 11.5–15.5)
WBC: 10.8 10*3/uL — AB (ref 4.0–10.5)

## 2015-09-04 LAB — BASIC METABOLIC PANEL
Anion gap: 12 (ref 5–15)
BUN: 17 mg/dL (ref 6–20)
CHLORIDE: 82 mmol/L — AB (ref 101–111)
CO2: 20 mmol/L — AB (ref 22–32)
CREATININE: 1.18 mg/dL (ref 0.61–1.24)
Calcium: 8.5 mg/dL — ABNORMAL LOW (ref 8.9–10.3)
GFR calc Af Amer: >60 mL/min (ref >60)
GFR calc non Af Amer: >60 mL/min (ref >60)
GLUCOSE: 106 mg/dL — AB (ref 65–99)
Potassium: 3.7 mmol/L (ref 3.5–5.1)
SODIUM: 114 mmol/L — AB (ref 135–145)

## 2015-09-04 LAB — CBG MONITORING, ED: Glucose-Capillary: 117 mg/dL — ABNORMAL HIGH (ref 65–99)

## 2015-09-04 LAB — DIFFERENTIAL
BASOS ABS: 0 10*3/uL (ref 0.0–0.1)
BASOS PCT: 0 %
Eosinophils Absolute: 0 10*3/uL (ref 0.0–0.7)
Eosinophils Relative: 0 %
LYMPHS PCT: 19 %
Lymphs Abs: 2 10*3/uL (ref 0.7–4.0)
MONO ABS: 1.4 10*3/uL — AB (ref 0.1–1.0)
Monocytes Relative: 13 %
NEUTROS ABS: 7.4 10*3/uL (ref 1.7–7.7)
NEUTROS PCT: 69 %

## 2015-09-04 MED ORDER — SODIUM CHLORIDE 0.9 % IV SOLN: 1000.0000 mL | INTRAVENOUS | Status: DC

## 2015-09-04 MED ORDER — DIPHENHYDRAMINE HCL 50 MG/ML IJ SOLN
25.0000 mg | Freq: Once | INTRAMUSCULAR | Status: AC
  Administered 2015-09-05: 25 mg via INTRAVENOUS
  Filled 2015-09-04: qty 1

## 2015-09-04 MED ORDER — SODIUM CHLORIDE 0.9 % IV SOLN
1000.0000 mL | Freq: Once | INTRAVENOUS | Status: AC
  Administered 2015-09-05: 1000 mL via INTRAVENOUS

## 2015-09-04 MED ORDER — METOCLOPRAMIDE HCL 5 MG/ML IJ SOLN
10.0000 mg | Freq: Once | INTRAMUSCULAR | Status: AC
  Administered 2015-09-05: 10 mg via INTRAVENOUS
  Filled 2015-09-04: qty 2

## 2015-09-04 NOTE — Telephone Encounter (Signed)
Pt advised.

## 2015-09-04 NOTE — Telephone Encounter (Signed)
Call patient and have him call our office after 4:00 today and I can see him tomorrow morning to check his urine and other blood work.

## 2015-09-04 NOTE — ED Triage Notes (Signed)
Pt arrives via EMS from home. On Tuesday, started to have lack of appetite, weakness, darker color urine, and nauseated tonight. IV established en route. Intermittent headaches that resolve with lying flat.

## 2015-09-04 NOTE — ED Notes (Signed)
Bed: ZO10WA05 Expected date:  Expected time:  Means of arrival:  Comments: [age] M dizzy, weakness

## 2015-09-04 NOTE — Telephone Encounter (Signed)
Patient stated he forgot to add one more thing to his message. Patient stated he got dehydrated on Saturday.

## 2015-09-04 NOTE — ED Provider Notes (Signed)
[facility]-EMERGENCY DEPT Provider Note   CSN: [id number] Arrival date & time: 09/04/15  2213  By signing my name below, I, Brandon Ho, attest that this documentation has been prepared under the direction and in the presence of Brandon Booze, MD. Electronically Signed: Phillis Ho, ED Scribe. 09/04/15. 11:58 PM.  First Provider Contact:  None    History   Chief Complaint Chief Complaint  Patient presents with  . Weakness    The history is provided by the patient. No language interpreter was used.   HPI Comments: Brandon Ho is a 70 y.o. male brought in by EMS who presents to the Emergency Department complaining of gradually worsening weakness onset 6 days ago. Pt reports associated decreased appetite, dark colored urine, intermittent, gradually worsening headaches, chills, frequency, decreased urine output, constipation, abdominal pain, nausea and vomiting bilious material that began tonight. Pt reports headache relief with lying down. He currently rates his headache 6/10. Pt states that he had an extraction two weeks ago for an abscessed tooth. Pt recently completed a course of penicillin. He states that he has been seen at Urgent Care a few times in the past week for these symptoms. He has had an EKG, x-rays, and CT scan this week. He was told by the dentist that he still has an infection in his gums from the abscessed tooth. He last urinated at 1:30 PM today. He states that he has not had a BM since 09/01/15 and it was loose. Pt was given an IV en route. He states that he has been taking ibuprofen that was prescribed to him at Urgent Care but felt worse after. He was also prescribed a new antibiotic tonight but did not take it. He denies hematuria or diarrhea.   Past Medical History:  Diagnosis Date  . Irregular heart beat 1975  . Tooth abscess     Patient Active Problem List   Diagnosis Date Noted  . Abnormal CT scan of head 09/02/2015  . Essential hypertension 08/31/2015     Past Surgical History:  Procedure Laterality Date  . CATARACT EXTRACTION W/ INTRAOCULAR LENS IMPLANT Bilateral        Home Medications    Prior to Admission medications   Medication Sig Start Date End Date Taking? Authorizing Provider  hydrochlorothiazide (HYDRODIURIL) 12.5 MG tablet Take 1 tablet (12.5 mg total) by mouth daily. 08/30/15  Yes Wallis Bamberg, PA-C  ondansetron (ZOFRAN ODT) 4 MG disintegrating tablet Take 1 tablet (4 mg total) by mouth every 8 (eight) hours as needed for nausea or vomiting. 09/02/15  Yes Collene Gobble, MD  propranolol (INDERAL) 40 MG tablet Take 40 mg by mouth 2 (two) times daily.    Yes Historical Provider, MD  amoxicillin (AMOXIL) 500 MG capsule Take 1 capsule (500 mg total) by mouth 3 (three) times daily with meals. Patient not taking: Reported on 09/04/2015 08/30/15   Wallis Bamberg, PA-C  chlorhexidine (PERIDEX) 0.12 % solution Use as directed 15 mLs in the mouth or throat 2 (two) times daily. Patient not taking: Reported on 09/04/2015 08/30/15   Wallis Bamberg, PA-C    Family History No family history on file.  Social History Social History  Substance Use Topics  . Smoking status: Never Smoker  . Smokeless tobacco: Never Used  . Alcohol use No     Allergies   Mouth rinse [cetylpyridinium chloride]   Review of Systems Review of Systems  Constitutional: Positive for appetite change and chills.  Gastrointestinal: Positive for abdominal pain, constipation,  nausea and vomiting. Negative for diarrhea.  Genitourinary: Positive for decreased urine volume and frequency. Negative for hematuria.  Neurological: Positive for weakness and headaches.  All other systems reviewed and are negative.    Physical Exam Updated Vital Signs BP 182/86 (BP Location: Right Arm)   Pulse 71   Temp 98.3 F (36.8 C) (Oral)   Resp 16   SpO2 100%   Physical Exam  Constitutional: He is oriented to person, place, and time. He appears well-developed and well-nourished.   HENT:  Head: Normocephalic and atraumatic.  Tooth #13 has been extracted and underlying gingiva appears normal; generally poor dentition  Eyes: EOM are normal. Pupils are equal, round, and reactive to light.  Neck: Normal range of motion. Neck supple. No JVD present.  Cardiovascular: Normal rate, regular rhythm and normal heart sounds.   No murmur heard. Pulmonary/Chest: Effort normal and breath sounds normal. He has no wheezes. He has no rales. He exhibits no tenderness.  Abdominal: Soft. He exhibits no distension and no mass. There is no tenderness.  Musculoskeletal: Normal range of motion. He exhibits no edema.  Lymphadenopathy:    He has no cervical adenopathy.  Neurological: He is alert and oriented to person, place, and time. No cranial nerve deficit. He exhibits normal muscle tone. Coordination normal.  Skin: Skin is warm and dry. No rash noted.  Psychiatric: He has a normal mood and affect. His behavior is normal. Judgment and thought content normal.  Nursing note and vitals reviewed.    ED Treatments / Results  DIAGNOSTIC STUDIES: Oxygen Saturation is 100% on RA, normal by my interpretation.    COORDINATION OF CARE: 11:55 PM-Discussed treatment plan which includes labs with pt at bedside and pt agreed to plan.   Labs (all labs ordered are listed, but only abnormal results are displayed) Labs Reviewed  BASIC METABOLIC PANEL - Abnormal; Notable for the following:       Result Value   Sodium 114 (*)    Chloride 82 (*)    CO2 20 (*)    Glucose, Bld 106 (*)    Calcium 8.5 (*)    All other components within normal limits  CBC - Abnormal; Notable for the following:    WBC 10.8 (*)    HCT 35.2 (*)    MCHC 38.4 (*)    All other components within normal limits  HEPATIC FUNCTION PANEL - Abnormal; Notable for the following:    AST 63 (*)    Total Bilirubin 1.6 (*)    Indirect Bilirubin 1.2 (*)    All other components within normal limits  DIFFERENTIAL - Abnormal; Notable  for the following:    Monocytes Absolute 1.4 (*)    All other components within normal limits  CBG MONITORING, ED - Abnormal; Notable for the following:    Glucose-Capillary 117 (*)    All other components within normal limits  URINALYSIS, ROUTINE W REFLEX MICROSCOPIC (NOT [facility])  SODIUM, URINE, RANDOM  CREATININE, URINE, RANDOM    EKG  EKG Interpretation  Date/Time:  Monday September 04 2015 22:53:31 EDT Ventricular Rate:  73 PR Interval:    QRS Duration: 88 QT Interval:  444 QTC Calculation: 490 R Axis:   64 Text Interpretation:  Sinus rhythm Nonspecific T abnrm, anterolateral leads Borderline prolonged QT interval No old tracing to compare Confirmed by [facility]  MD, Broden Holt ([id number]) on 09/04/2015 11:48:50 PM       Procedures Procedures (including critical care time) CRITICAL CARE Performed by: Brandon BoozeGLICK,Tamika Nou  Total critical care time: 45 minutes Critical care time was exclusive of separately billable procedures and treating other patients. Critical care was necessary to treat or prevent imminent or life-threatening deterioration. Critical care was time spent personally by me on the following activities: development of treatment plan with patient and/or surrogate as well as nursing, discussions with consultants, evaluation of patient's response to treatment, examination of patient, obtaining history from patient or surrogate, ordering and performing treatments and interventions, ordering and review of laboratory studies, ordering and review of radiographic studies, pulse oximetry and re-evaluation of patient's condition.  Medications Ordered in ED Medications  0.9 %  sodium chloride infusion (not administered)    Followed by  0.9 %  sodium chloride infusion (1,000 mLs Intravenous New Bag/Given 09/05/15 0005)    Followed by  0.9 %  sodium chloride infusion (not administered)  metoCLOPramide (REGLAN) injection 10 mg (not administered)  diphenhydrAMINE (BENADRYL) injection 25 mg (not  administered)     Initial Impression / Assessment and Plan / ED Course  I have reviewed the triage vital signs and the nursing notes.  Pertinent labs & imaging results that were available during my care of the patient were reviewed by me and considered in my medical decision making (see chart for details).  Clinical Course    Patient with ongoing problems with nausea and headache. Old records are reviewed, and he had been followed at urgent care for these symptoms as well as dental infection following extraction of abscessed tooth. 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 Laboratory workup today shows severe hyponatremia with commensurate drop in serum right. 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 IV hydration is started with normal saline. Case is discussed with Dr.Niu of [facility] who agrees to admit the patient. In addition, he was given metoclopramide and diphenhydramine to help treat his headache.   Final Clinical Impressions(s) / ED Diagnoses   Final diagnoses:  Acute hyponatremia  Nausea  Headache, unspecified headache type  Headache  I personally performed the services described in this documentation, which was scribed in my presence. The recorded information has been reviewed and is accurate.     New Prescriptions New Prescriptions   No medications on file     Brandon Booze, MD 09/06/15 [phone]

## 2015-09-04 NOTE — Telephone Encounter (Signed)
Patient is having issues when he urinates. The volume is much less and the color is darker than it has been in the past several days. He is having some discomfort in his stomach area. Kind of a full feeling. Advised pt to come in to be seen, but he wants Dr. Cleta Albertsaub to call him back and advise him what he needs to do.

## 2015-09-04 NOTE — Telephone Encounter (Signed)
Was seen by dentist today.He has a persistent dental infection. Started on cleocin. Nausea with vomiting. Decreased urination. No BM with minimal gas. [facility] and [facility] and consideration for scanning.

## 2015-09-04 NOTE — Telephone Encounter (Signed)
I called back to check on patient. He was week and dizzty I advised him to call 911.

## 2015-09-05 ENCOUNTER — Observation Stay (HOSPITAL_COMMUNITY): Payer: Medicare Other

## 2015-09-05 ENCOUNTER — Encounter (HOSPITAL_COMMUNITY): Payer: Self-pay | Admitting: *Deleted

## 2015-09-05 ENCOUNTER — Ambulatory Visit: Payer: Self-pay

## 2015-09-05 DIAGNOSIS — E876 Hypokalemia: Secondary | ICD-10-CM | POA: Diagnosis present

## 2015-09-05 DIAGNOSIS — R531 Weakness: Secondary | ICD-10-CM | POA: Diagnosis present

## 2015-09-05 DIAGNOSIS — E871 Hypo-osmolality and hyponatremia: Secondary | ICD-10-CM | POA: Diagnosis present

## 2015-09-05 DIAGNOSIS — R51 Headache: Secondary | ICD-10-CM

## 2015-09-05 DIAGNOSIS — Z8249 Family history of ischemic heart disease and other diseases of the circulatory system: Secondary | ICD-10-CM | POA: Diagnosis not present

## 2015-09-05 DIAGNOSIS — R519 Headache, unspecified: Secondary | ICD-10-CM | POA: Diagnosis present

## 2015-09-05 DIAGNOSIS — K047 Periapical abscess without sinus: Secondary | ICD-10-CM | POA: Diagnosis present

## 2015-09-05 DIAGNOSIS — Z6823 Body mass index (BMI) 23.0-23.9, adult: Secondary | ICD-10-CM | POA: Diagnosis not present

## 2015-09-05 DIAGNOSIS — E860 Dehydration: Secondary | ICD-10-CM | POA: Diagnosis present

## 2015-09-05 DIAGNOSIS — Z823 Family history of stroke: Secondary | ICD-10-CM | POA: Diagnosis not present

## 2015-09-05 DIAGNOSIS — E43 Unspecified severe protein-calorie malnutrition: Secondary | ICD-10-CM | POA: Diagnosis present

## 2015-09-05 DIAGNOSIS — R338 Other retention of urine: Secondary | ICD-10-CM | POA: Diagnosis present

## 2015-09-05 DIAGNOSIS — N401 Enlarged prostate with lower urinary tract symptoms: Secondary | ICD-10-CM | POA: Diagnosis present

## 2015-09-05 DIAGNOSIS — I10 Essential (primary) hypertension: Secondary | ICD-10-CM | POA: Diagnosis present

## 2015-09-05 DIAGNOSIS — G319 Degenerative disease of nervous system, unspecified: Secondary | ICD-10-CM | POA: Diagnosis present

## 2015-09-05 DIAGNOSIS — K5900 Constipation, unspecified: Secondary | ICD-10-CM | POA: Diagnosis present

## 2015-09-05 LAB — CBC
HEMATOCRIT: 33.4 % — AB (ref 39.0–52.0)
HEMOGLOBIN: 12.6 g/dL — AB (ref 13.0–17.0)
MCH: 30.5 pg (ref 26.0–34.0)
MCHC: 37.7 g/dL — AB (ref 30.0–36.0)
MCV: 80.9 fL (ref 78.0–100.0)
Platelets: 188 10*3/uL (ref 150–400)
RBC: 4.13 MIL/uL — AB (ref 4.22–5.81)
RDW: 12.9 % (ref 11.5–15.5)
WBC: 9.8 10*3/uL (ref 4.0–10.5)

## 2015-09-05 LAB — BASIC METABOLIC PANEL
Anion gap: 10 (ref 5–15)
Anion gap: 9 (ref 5–15)
Anion gap: 8 (ref 5–15)
BUN: 15 mg/dL (ref 6–20)
BUN: 12 mg/dL (ref 6–20)
BUN: 11 mg/dL (ref 6–20)
CALCIUM: 7.6 mg/dL — AB (ref 8.9–10.3)
CALCIUM: 7.9 mg/dL — AB (ref 8.9–10.3)
CHLORIDE: 90 mmol/L — AB (ref 101–111)
CHLORIDE: 93 mmol/L — AB (ref 101–111)
CHLORIDE: 96 mmol/L — AB (ref 101–111)
CO2: 20 mmol/L — ABNORMAL LOW (ref 22–32)
CO2: 22 mmol/L (ref 22–32)
CO2: 23 mmol/L (ref 22–32)
CREATININE: 1.09 mg/dL (ref 0.61–1.24)
CREATININE: 1.12 mg/dL (ref 0.61–1.24)
CREATININE: 1.15 mg/dL (ref 0.61–1.24)
Calcium: 8.3 mg/dL — ABNORMAL LOW (ref 8.9–10.3)
GFR calc Af Amer: >60 mL/min (ref >60)
GFR calc Af Amer: >60 mL/min (ref >60)
GFR calc Af Amer: >60 mL/min (ref >60)
GFR calc non Af Amer: >60 mL/min (ref >60)
GFR calc non Af Amer: >60 mL/min (ref >60)
Glucose, Bld: 104 mg/dL — ABNORMAL HIGH (ref 65–99)
Glucose, Bld: 103 mg/dL — ABNORMAL HIGH (ref 65–99)
Glucose, Bld: 112 mg/dL — ABNORMAL HIGH (ref 65–99)
Potassium: 2.8 mmol/L — ABNORMAL LOW (ref 3.5–5.1)
Potassium: 2.7 mmol/L — CL (ref 3.5–5.1)
Potassium: 3.1 mmol/L — ABNORMAL LOW (ref 3.5–5.1)
SODIUM: 120 mmol/L — AB (ref 135–145)
SODIUM: 124 mmol/L — AB (ref 135–145)
SODIUM: 127 mmol/L — AB (ref 135–145)

## 2015-09-05 LAB — OSMOLALITY, URINE: Osmolality, Ur: 159 mOsm/kg — ABNORMAL LOW (ref 300–900)

## 2015-09-05 LAB — URINE MICROSCOPIC-ADD ON

## 2015-09-05 LAB — URINALYSIS, ROUTINE W REFLEX MICROSCOPIC
BILIRUBIN URINE: NEGATIVE
GLUCOSE, UA: NEGATIVE mg/dL
Ketones, ur: 15 mg/dL — AB
Leukocytes, UA: NEGATIVE
Nitrite: NEGATIVE
PH: 6.5 (ref 5.0–8.0)
PROTEIN: NEGATIVE mg/dL
SPECIFIC GRAVITY, URINE: 1.006 (ref 1.005–1.030)

## 2015-09-05 LAB — HEPATIC FUNCTION PANEL
ALBUMIN: 4.1 g/dL (ref 3.5–5.0)
ALT: 37 U/L (ref 17–63)
AST: 63 U/L — ABNORMAL HIGH (ref 15–41)
Alkaline Phosphatase: 59 U/L (ref 38–126)
BILIRUBIN INDIRECT: 1.2 mg/dL — AB (ref 0.3–0.9)
Bilirubin, Direct: 0.4 mg/dL (ref 0.1–0.5)
TOTAL PROTEIN: 7.6 g/dL (ref 6.5–8.1)
Total Bilirubin: 1.6 mg/dL — ABNORMAL HIGH (ref 0.3–1.2)

## 2015-09-05 LAB — OSMOLALITY: OSMOLALITY: 249 mosm/kg — AB (ref 275–295)

## 2015-09-05 LAB — T4, FREE: Free T4: 1.73 ng/dL — ABNORMAL HIGH (ref 0.61–1.12)

## 2015-09-05 LAB — TSH: TSH: 6.195 u[IU]/mL — AB (ref 0.350–4.500)

## 2015-09-05 LAB — CREATININE, URINE, RANDOM: CREATININE, URINE: 30.09 mg/dL

## 2015-09-05 LAB — SODIUM, URINE, RANDOM: SODIUM UR: 24 mmol/L

## 2015-09-05 MED ORDER — POTASSIUM CHLORIDE CRYS ER 20 MEQ PO TBCR
40.0000 meq | EXTENDED_RELEASE_TABLET | Freq: Once | ORAL | Status: AC
  Administered 2015-09-05: 40 meq via ORAL
  Filled 2015-09-05: qty 2

## 2015-09-05 MED ORDER — SODIUM CHLORIDE 0.9% FLUSH
3.0000 mL | Freq: Two times a day (BID) | INTRAVENOUS | Status: DC
  Administered 2015-09-05 – 2015-09-06 (×3): 3 mL via INTRAVENOUS

## 2015-09-05 MED ORDER — SODIUM CHLORIDE 0.9 % IV SOLN
1000.0000 mL | INTRAVENOUS | Status: DC
  Administered 2015-09-05: 1000 mL via INTRAVENOUS

## 2015-09-05 MED ORDER — ACETAMINOPHEN 650 MG RE SUPP: 650.0000 mg | Freq: Four times a day (QID) | RECTAL | Status: DC | PRN

## 2015-09-05 MED ORDER — AMLODIPINE BESYLATE 5 MG PO TABS
5.0000 mg | ORAL_TABLET | Freq: Every day | ORAL | Status: DC
  Administered 2015-09-06 – 2015-09-07 (×2): 5 mg via ORAL
  Filled 2015-09-05 (×3): qty 1

## 2015-09-05 MED ORDER — HYDRALAZINE HCL 20 MG/ML IJ SOLN
5.0000 mg | INTRAMUSCULAR | Status: DC | PRN
  Administered 2015-09-05: 5 mg via INTRAVENOUS
  Filled 2015-09-05: qty 1

## 2015-09-05 MED ORDER — PROPRANOLOL HCL 40 MG PO TABS
40.0000 mg | ORAL_TABLET | Freq: Two times a day (BID) | ORAL | Status: DC
  Administered 2015-09-05 – 2015-09-07 (×5): 40 mg via ORAL
  Filled 2015-09-05 (×6): qty 1

## 2015-09-05 MED ORDER — ACETAMINOPHEN 325 MG PO TABS
650.0000 mg | ORAL_TABLET | Freq: Four times a day (QID) | ORAL | Status: DC | PRN
  Filled 2015-09-05: qty 2

## 2015-09-05 MED ORDER — ENOXAPARIN SODIUM 40 MG/0.4ML ~~LOC~~ SOLN
40.0000 mg | SUBCUTANEOUS | Status: DC
Start: 2015-09-05 — End: 2015-09-07
  Administered 2015-09-05 – 2015-09-07 (×3): 40 mg via SUBCUTANEOUS
  Filled 2015-09-05 (×3): qty 0.4

## 2015-09-05 MED ORDER — ONDANSETRON 4 MG PO TBDP: 4.0000 mg | ORAL_TABLET | Freq: Three times a day (TID) | ORAL | Status: DC | PRN

## 2015-09-05 NOTE — Progress Notes (Signed)
MD notified of K+ 2.7. SRP, RN

## 2015-09-05 NOTE — Care Management Obs Status (Signed)
MEDICARE OBSERVATION STATUS NOTIFICATION   Patient Details  Name: Brandon CoffinCharles W Labrador MRN: [id number] Date of Birth: Nov 04, 1945   Medicare Observation Status Notification Given:  Yes    Geni BersMcGibboney, Anella Nakata, RN 09/05/2015, 2:46 PM

## 2015-09-05 NOTE — Progress Notes (Signed)
Patient ID: Brandon Ho, male   DOB: August 26, 1945, 70 y.o.   MRN: [id number]  PROGRESS NOTE    Brandon Ho [facility]  NWG:[id number] DOB: 02-13-1945 DOA: 09/04/2015  PCP: Lucilla Edin, MD   Brief Narrative:   Pt admitted after midnight, please refer to admission note done 09/05/15 for details. 70 y.o. male with past medical history significant for hypertension, recent tooth extraction two weeks PTA for tooth abscess who presented with generalized weakness and headache for past 6 days PTA. He was seen in ED and had CT-head on 09/02/15 which showed nonspecific patchy areas of low density within the white matter bilaterally. He still has headache, constant, 6/10 in intensity, non radiating, alleviated by laying down. Patient reported generalized weakness. Pt was found to have sodium of 114 on admission, started on IV fluids with Q 6 hour sodium checks.  Assessment & Plan:   Principal Problem:   Hyponatremia, hypo-osmolar  - Serum osmolarity 249, urine sodium 24 and urine osmolarity 156 - TSH 6.195 - Based on above values, likely due to possible hypothyroidism - Dehydration less likely as urine sodium is typically less than 20 with dehydration.however he is responding nicely to IV fluids  - Primary polydipsia PP) also less likely as urine osm typically less than 100 in PP - SIADH less likely as no evidence of acute malignancy  - Will check fT4 - Continue IV fluids  - Check BMP in am   DVT prophylaxis: Lovenox suBQ Code Status: full code  Family Communication: no family at the bedside this am Disposition Plan: home once sodium closer to normal range    Consultants:   PT  Procedures:   None   Antimicrobials:   None    Subjective: No overnight events.   Objective: Vitals:   09/05/15 0442 09/05/15 0658 09/05/15 1248 09/05/15 1311  BP: (!) 175/90 138/71  (!) 156/73  Pulse: 91   82  Resp: 16   18  Temp: 97.6 F (36.4 C)  97.1 F (36.2 C) 98 F (36.7 C)  TempSrc: Oral   Oral    SpO2: 100%   99%  Weight:      Height:        Intake/Output Summary (Last 24 hours) at 09/05/15 1527 Last data filed at 09/05/15 1404  Gross per 24 hour  Intake                0 ml  Output             3675 ml  Net            -3675 ml   Filed Weights   09/05/15 0140  Weight: 63.5 kg (140 lb)    Examination:  General exam: Appears calm and comfortable  Respiratory system: Clear to auscultation. Respiratory effort normal. Cardiovascular system: S1 & S2 heard, RRR. No JVD, murmurs, rubs, gallops or clicks. No pedal edema. Gastrointestinal system: Abdomen is nondistended, soft and nontender.  Central nervous system: Alert and oriented. No focal neurological deficits. Extremities: Symmetric 5 x 5 power. Skin: No rashes, lesions or ulcers Psychiatry: Judgement and insight appear normal. Mood & affect appropriate.   Data Reviewed: I have personally reviewed following labs and imaging studies  CBC:  Recent Labs Lab 08/30/15 1015 08/31/15 1040 09/04/15 2300 09/05/15 0159  WBC 9.7 9.8 10.8* 9.8  NEUTROABS  --   --  7.4  --   HGB 14.4 14.3 13.5 12.6*  HCT 40.9* 41.5* 35.2* 33.4*  MCV  87.7 88.3 79.6 80.9  PLT  --   --  189 188   Basic Metabolic Panel:  Recent Labs Lab 08/30/15 1023 09/04/15 2300 09/05/15 0159 09/05/15 0734 09/05/15 1338  NA 136 114* 120* 124* 127*  K 4.5 3.7 2.8* 2.7* 3.1*  CL 102 82* 90* 93* 96*  CO2 23 20* 20* 22 23  GLUCOSE 111* 106* 104* 103* 112*  BUN 10 17 15 12 11   CREATININE 1.31* 1.18 1.09 1.12 1.15  CALCIUM 9.1 8.5* 7.6* 7.9* 8.3*   GFR: Estimated Creatinine Clearance: 52.7 mL/min (by C-G formula based on SCr of 1.15 mg/dL). Liver Function Tests:  Recent Labs Lab 09/04/15 2300  AST 63*  ALT 37  ALKPHOS 59  BILITOT 1.6*  PROT 7.6  ALBUMIN 4.1   No results for input(s): LIPASE, AMYLASE in the last 168 hours. No results for input(s): AMMONIA in the last 168 hours. Coagulation Profile: No results for input(s): INR, PROTIME in  the last 168 hours. Cardiac Enzymes:  Recent Labs Lab 08/31/15 1031  TROPONINI 0.01   BNP (last 3 results) No results for input(s): PROBNP in the last 8760 hours. HbA1C: No results for input(s): HGBA1C in the last 72 hours. CBG:  Recent Labs Lab 09/04/15 2310  GLUCAP 117*   Lipid Profile: No results for input(s): CHOL, HDL, LDLCALC, TRIG, CHOLHDL, LDLDIRECT in the last 72 hours. Thyroid Function Tests:  Recent Labs  09/05/15 0159  TSH 6.195*   Anemia Panel: No results for input(s): VITAMINB12, FOLATE, FERRITIN, TIBC, IRON, RETICCTPCT in the last 72 hours. Urine analysis:    Component Value Date/Time   COLORURINE YELLOW 09/05/2015 0443   APPEARANCEUR CLEAR 09/05/2015 0443   LABSPEC 1.006 09/05/2015 0443   PHURINE 6.5 09/05/2015 0443   GLUCOSEU NEGATIVE 09/05/2015 0443   HGBUR TRACE (A) 09/05/2015 0443   BILIRUBINUR NEGATIVE 09/05/2015 0443   KETONESUR 15 (A) 09/05/2015 0443   PROTEINUR NEGATIVE 09/05/2015 0443   NITRITE NEGATIVE 09/05/2015 0443   LEUKOCYTESUR NEGATIVE 09/05/2015 0443   Sepsis Labs: @LABRCNTIP (procalcitonin:4,lacticidven:4)   )No results found for this or any previous visit (from the past 240 hour(s)).    Radiology Studies: Dg Cervical Spine Complete  09/02/2015 1. Limited views of the odontoid process. No fracture or malalignment seen within this limitation. Electronically Signed   By: Gerome Samavid  Williams III M.D   On: 09/02/2015 11:40   Ct Head Wo Contrast 09/02/2015 1. Patchy areas of low density within the white matter bilaterally, nonspecific, likely related to chronic small vessel ischemia. 2. No acute findings.  No intracranial mass or hemorrhage. Electronically Signed   By: Bary RichardStan  Maynard M.D.   On: 09/02/2015 13:00    Scheduled Meds: . amLODipine  5 mg Oral Daily  . enoxaparin (LOVENOX) injection  40 mg Subcutaneous Q24H  . propranolol  40 mg Oral BID  . sodium chloride flush  3 mL Intravenous Q12H   Continuous Infusions:    LOS: 0  days    Time spent: 25 minutes No charge: pt admitted after midnight  Greater than 50% of the time spent on counseling and coordinating the care.   Manson PasseyEVINE, ALMA, MD [facility] Pager [phone]  If 7PM-7AM, please contact night-coverage [email or web address] Password [email or web address] 09/05/2015, 3:27 PM

## 2015-09-05 NOTE — Telephone Encounter (Signed)
See previous message. EMS called and they will transport to the hospital.

## 2015-09-05 NOTE — Progress Notes (Signed)
Upon pt arrival to floor; he was unable to urinate. Pt stated that he "had not voided in 12 hours." Did a bladder scan that showed >91499ml. Got an order to do an in and out catheter. Pt wanted to try to void one last time before the in and out catheter and ended up voiding 900 ml. Pt is having a lot of difficulty starting and it almost seems like he's having to push it out in little bits. It took him 20 minutes to void the amount above. Did another bladder scan which showed 325ml still left. Will hold off on the in and out catheter for right now and will continue to monitor patient. Rockne Coonsorcoran, Kimbely Whiteaker C, RN 2:39 AM 09/05/15

## 2015-09-05 NOTE — Progress Notes (Signed)
Pt voided 650ml; did a post-void bladder scan and it still showed around 350ml. Continuing to monitor patient's output. Rockne Coonsorcoran, Yehoshua Vitelli C, RN 4:42 AM 09/05/15

## 2015-09-05 NOTE — Telephone Encounter (Signed)
See previous message

## 2015-09-05 NOTE — Evaluation (Signed)
Physical Therapy One Time Evaluation Patient Details Name: Brandon CoffinCharles W Dewolfe MRN: [id number] DOB: 1945/05/20 Today's Date: 09/05/2015   History of Present Illness  70 y.o. male with medical history significant of hypertension, history of irregular heartbeat, recent tooth extraction two weeks ago for an abscessed tooth, who presents with generalized weakness and headache  Clinical Impression  Patient evaluated by Physical Therapy with no further acute PT needs identified. All education has been completed and the patient has no further questions. Pt mobilizing well and only c/o generalized weakness however has not been eating as much and feeling poorly since tooth extraction a couple weeks ago.  See below for any follow-up Physical Therapy or equipment needs. PT is signing off. Thank you for this referral.     Follow Up Recommendations No PT follow up    Equipment Recommendations  None recommended by PT    Recommendations for Other Services       Precautions / Restrictions Precautions Precautions: None      Mobility  Bed Mobility Overal bed mobility: Modified Independent                Transfers   Equipment used: None Transfers: Sit to/from Stand Sit to Stand: Supervision            Ambulation/Gait Ambulation/Gait assistance: Min guard;Supervision Ambulation Distance (Feet): 300 Feet Assistive device: None Gait Pattern/deviations: Decreased stride length     General Gait Details: steady, no LOB observed, pt reports generalized weakness, mild R sided headache  Stairs            Wheelchair Mobility    Modified Rankin (Stroke Patients Only)       Balance Overall balance assessment: History of Falls                                           Pertinent Vitals/Pain Pain Assessment: No/denies pain    Home Living Family/patient expects to be discharged to:: Private residence Living Arrangements: Alone   Type of Home: House Home  Access: Stairs to enter Entrance Stairs-Rails: Right Entrance Stairs-Number of Steps: " a few" Home Layout: One level Home Equipment: Cane - single point      Prior Function Level of Independence: Independent               Hand Dominance        Extremity/Trunk Assessment               Lower Extremity Assessment: Generalized weakness      Cervical / Trunk Assessment: Kyphotic  Communication   Communication: No difficulties  Cognition Arousal/Alertness: Awake/alert Behavior During Therapy: WFL for tasks assessed/performed Overall Cognitive Status: Within Functional Limits for tasks assessed                      General Comments General comments (skin integrity, edema, etc.): Pt reports fall about a week ago when trying to put on trousers.  Pt was standing and states that was a poor idea.    Exercises        Assessment/Plan    PT Assessment Patent does not need any further PT services  PT Diagnosis Difficulty walking   PT Problem List    PT Treatment Interventions     PT Goals (Current goals can be found in the Care Plan section) Acute Rehab PT Goals PT Goal Formulation:  All assessment and education complete, DC therapy    Frequency     Barriers to discharge        Co-evaluation               End of Session   Activity Tolerance: Patient tolerated treatment well Patient left: in chair;with call bell/phone within reach;with chair alarm set Nurse Communication: Mobility status    Functional Assessment Tool Used: clinical judgement Functional Limitation: Mobility: Walking and moving around Mobility: Walking and Moving Around Current Status [phone]): 0 percent impaired, limited or restricted Mobility: Walking and Moving Around Goal Status [phone]): 0 percent impaired, limited or restricted Mobility: Walking and Moving Around Discharge Status [phone]): 0 percent impaired, limited or restricted    Time: 1032-1049 PT Time Calculation  (min) (ACUTE ONLY): 17 min   Charges:   PT Evaluation $PT Eval Low Complexity: 1 Procedure     PT G Codes:   PT G-Codes **NOT FOR INPATIENT CLASS** Functional Assessment Tool Used: clinical judgement Functional Limitation: Mobility: Walking and moving around Mobility: Walking and Moving Around Current Status (Z3086): 0 percent impaired, limited or restricted Mobility: Walking and Moving Around Goal Status (V7846): 0 percent impaired, limited or restricted Mobility: Walking and Moving Around Discharge Status (N6295): 0 percent impaired, limited or restricted    Enisa Runyan,KATHrine E 09/05/2015, 12:57 PM Zenovia Jarred, PT, DPT 09/05/2015 Pager: [phone]

## 2015-09-05 NOTE — ED Notes (Signed)
PT unable to provide urine at this time 

## 2015-09-05 NOTE — H&P (Signed)
History and Physical    Brandon Ho:[id number] DOB: [birth year] DOA: 09/04/2015  Referring MD/NP/PA:   PCP: Lucilla Edin, MD   Patient coming from:  The patient is coming from home.  At baseline, pt is independent for most of ADL.   Chief Complaint: Generalized weakness and HA  HPI: Brandon Ho is a 70 y.o. male with medical history significant of hypertension, history of irregular heartbeat, recent tooth extraction two weeks ago for an abscessed tooth, who presents with generalized weakness and headache.  Patient reports that he has been having headache for about 6 days. He was seen in ED and had CT-head on 09/02/15, which showed nonspecific patchy areas of low density within the white matter bilaterally. He states that he still has headache which is constant, 6 out of 10 in severity, nonradiating. It is alleviated by laying down. No neck rigidity. Patient states that he has generalized weakness, but no unilateral weakness, slurred speech, blurred vision or facial droop. She has nausea, but no vomiting or abdominal pain. Patient states that he had 1 loose stool BM on Friday, which has resolved. Currently no diarrhea. She does not have cough, chest pain, shortness of breath, fever or chills. She has burning on urination sometimes, but denies urinary frequency or dysuria. She has generalized weakness, decreased oral intake and very poor appetite. Of note, pt states that he had tooth extraction two weeks ago for an abscessed tooth. He completed 2 week course of Abx. He was told by the dentist that he still has an infection in his gums from the abscessed tooth. He was also prescribed a new antibiotic tonight but did not take it. He denies hematuria or diarrhea.   ED Course: pt was found to have sodium 114, creatinine 1.18, WBC 10.8, hemorrhage or normal, no tachycardia, no tachypnea, pending urinalysis. Patient is placed on telemetry bed for observation.  Review of Systems:   General: no  fevers, chills, no changes in body weight, has poor appetite, has fatigue HEENT: no blurry vision, hearing changes or sore throat Pulm: no dyspnea, coughing, wheezing CV: no chest pain, no palpitations Abd: has nausea, no vomiting, abdominal pain, diarrhea, constipation GU: no dysuria, has burning on urination, no increased urinary frequency, hematuria  Ext: no leg edema Neuro: no unilateral weakness, numbness, or tingling, no vision change or hearing loss Skin: no rash MSK: No muscle spasm, no deformity, no limitation of range of movement in spin Heme: No easy bruising.  Travel history: No recent long distant travel.  Allergy:  Allergies  Allergen Reactions  . Mouth Rinse [Cetylpyridinium Chloride]     Facial tingling     Past Medical History:  Diagnosis Date  . Irregular heart beat 1975  . Tooth abscess     Past Surgical History:  Procedure Laterality Date  . CATARACT EXTRACTION W/ INTRAOCULAR LENS IMPLANT Bilateral     Social History:  reports that he has never smoked. He has never used smokeless tobacco. He reports that he does not drink alcohol or use drugs.  Family History:  Family History  Problem Relation Age of Onset  . Heart disease Mother   . Hypertension Mother   . Stroke Mother      Prior to Admission medications   Medication Sig Start Date End Date Taking? Authorizing Provider  hydrochlorothiazide (HYDRODIURIL) 12.5 MG tablet Take 1 tablet (12.5 mg total) by mouth daily. 08/30/15  Yes Wallis Bamberg, PA-C  ondansetron (ZOFRAN ODT) 4 MG disintegrating tablet Take  1 tablet (4 mg total) by mouth every 8 (eight) hours as needed for nausea or vomiting. 09/02/15  Yes Collene Gobble, MD  propranolol (INDERAL) 40 MG tablet Take 40 mg by mouth 2 (two) times daily.    Yes Historical Provider, MD  amoxicillin (AMOXIL) 500 MG capsule Take 1 capsule (500 mg total) by mouth 3 (three) times daily with meals. Patient not taking: Reported on 09/04/2015 08/30/15   Wallis Bamberg, PA-C    chlorhexidine (PERIDEX) 0.12 % solution Use as directed 15 mLs in the mouth or throat 2 (two) times daily. Patient not taking: Reported on 09/04/2015 08/30/15   Wallis Bamberg, PA-C    Physical Exam: Vitals:   09/04/15 2218 09/05/15 0140 09/05/15 0442  BP: 182/86 (!) 187/88 (!) 175/90  Pulse: 71 90 91  Resp: 16 18 16   Temp: 98.3 F (36.8 C) 98.2 F (36.8 C) 97.6 F (36.4 C)  TempSrc: Oral Oral Oral  SpO2: 100% 99% 100%  Weight:  63.5 kg (140 lb)   Height:  5\' 5"  (1.651 m)    General: Not in acute distress HEENT:       Eyes: PERRL, EOMI, no scleral icterus.       ENT: No discharge from the ears and nose, no pharynx injection, no tonsillar enlargement.        Neck: No JVD, no bruit, no mass felt. Heme: No neck lymph node enlargement. Cardiac: S1/S2, RRR, No murmurs, No gallops or rubs. Pulm:  No rales, wheezing, rhonchi or rubs. Abd: Soft, nondistended, nontender, no rebound pain, no organomegaly, BS present. GU: No hematuria Ext: No pitting leg edema bilaterally. 2+DP/PT pulse bilaterally. Musculoskeletal: No joint deformities, No joint redness or warmth, no limitation of ROM in spin. Skin: No rashes.  Neuro: Alert, oriented X3, cranial nerves II-XII grossly intact, moves all extremities normally. Muscle strength 4/5 in all extremities, sensation to light touch intact. Brachial reflex 2+ bilaterally. Negative Babinski's sign.  Psych: Patient is not psychotic, no suicidal or hemocidal ideation.  Labs on Admission: I have personally reviewed following labs and imaging studies  CBC:  Recent Labs Lab 08/30/15 1015 08/31/15 1040 09/04/15 2300 09/05/15 0159  WBC 9.7 9.8 10.8* 9.8  NEUTROABS  --   --  7.4  --   HGB 14.4 14.3 13.5 12.6*  HCT 40.9* 41.5* 35.2* 33.4*  MCV 87.7 88.3 79.6 80.9  PLT  --   --  189 188   Basic Metabolic Panel:  Recent Labs Lab 08/30/15 1023 09/04/15 2300 09/05/15 0159  NA 136 114* 120*  K 4.5 3.7 2.8*  CL 102 82* 90*  CO2 23 20* 20*  GLUCOSE  111* 106* 104*  BUN 10 17 15   CREATININE 1.31* 1.18 1.09  CALCIUM 9.1 8.5* 7.6*   GFR: Estimated Creatinine Clearance: 55.6 mL/min (by C-G formula based on SCr of 1.09 mg/dL). Liver Function Tests:  Recent Labs Lab 09/04/15 2300  AST 63*  ALT 37  ALKPHOS 59  BILITOT 1.6*  PROT 7.6  ALBUMIN 4.1   No results for input(s): LIPASE, AMYLASE in the last 168 hours. No results for input(s): AMMONIA in the last 168 hours. Coagulation Profile: No results for input(s): INR, PROTIME in the last 168 hours. Cardiac Enzymes:  Recent Labs Lab 08/31/15 1031  TROPONINI 0.01   BNP (last 3 results) No results for input(s): PROBNP in the last 8760 hours. HbA1C: No results for input(s): HGBA1C in the last 72 hours. CBG:  Recent Labs Lab 09/04/15 2310  GLUCAP 117*   Lipid Profile: No results for input(s): CHOL, HDL, LDLCALC, TRIG, CHOLHDL, LDLDIRECT in the last 72 hours. Thyroid Function Tests:  Recent Labs  09/05/15 0159  TSH 6.195*   Anemia Panel: No results for input(s): VITAMINB12, FOLATE, FERRITIN, TIBC, IRON, RETICCTPCT in the last 72 hours. Urine analysis: No results found for: COLORURINE, APPEARANCEUR, LABSPEC, PHURINE, GLUCOSEU, HGBUR, BILIRUBINUR, KETONESUR, PROTEINUR, UROBILINOGEN, NITRITE, LEUKOCYTESUR Sepsis Labs: @LABRCNTIP (procalcitonin:4,lacticidven:4) )No results found for this or any previous visit (from the past 240 hour(s)).   Radiological Exams on Admission: No results found.   EKG: Independently reviewed.  Sinus rhythm, QTC 490, T-wave inversion in V1-V2. Assessment/Plan Principal Problem:   Hyponatremia Active Problems:   Essential hypertension   Abnormal CT scan of head   Generalized weakness   Headache   Hyponatremia: Patient's sodium is 114 on admission. Likely due to poor oral intake and continuation of HCTZ. His mental status is normal. Pt received 2L NS and then 125 cc/h of NS in ED. His sodium has increase to 120.  -will place on tele  bed for obs -will hold off IV NS now to avoid over correction. -BMP q6h -We'll check urinalysis, urine sodium, urine osmolality, serum osmolality. -check TSH -Hold HCTZ  Essential hypertension: bp is 182/86 on admission -Hold HCTZ -start amlodipine 5 mg daily -IV hydralazine when necessary  Generalized weakness: Likely due to hyponatremia and malnutrition secondary to decreased oral intake. -pt/PT -Consult to nutrition  Headache: Etiology is not clear. No meningeal sign. Less likely to have meningitis.  He had CT-head on 09/02/15, which showed nonspecific patchy areas of low density within the white matter bilaterally. Given his severe weakness and hyponatremia, will get MRI to r/o of intracranial abnormalities. -will get MRI of brain for further evaluation   DVT ppx: SQ Lovenox Code Status: Full code Family Communication:  Yes, patient's Lady friend at bed side Disposition Plan:  Anticipate discharge back to previous home environment Consults called:  none Admission status: Obs / tele     Date of Service 09/05/2015    Lorretta HarpIU, Milo Schreier [facility] Pager [phone]  If 7PM-7AM, please contact night-coverage [email or web address] Password [email or web address] 09/05/2015, 4:44 AM

## 2015-09-06 DIAGNOSIS — K047 Periapical abscess without sinus: Secondary | ICD-10-CM | POA: Diagnosis present

## 2015-09-06 DIAGNOSIS — R930 Abnormal findings on diagnostic imaging of skull and head, not elsewhere classified: Secondary | ICD-10-CM

## 2015-09-06 DIAGNOSIS — I10 Essential (primary) hypertension: Secondary | ICD-10-CM

## 2015-09-06 DIAGNOSIS — E871 Hypo-osmolality and hyponatremia: Principal | ICD-10-CM

## 2015-09-06 DIAGNOSIS — E43 Unspecified severe protein-calorie malnutrition: Secondary | ICD-10-CM | POA: Diagnosis present

## 2015-09-06 DIAGNOSIS — R531 Weakness: Secondary | ICD-10-CM

## 2015-09-06 DIAGNOSIS — E876 Hypokalemia: Secondary | ICD-10-CM | POA: Diagnosis present

## 2015-09-06 LAB — MAGNESIUM: MAGNESIUM: 2.1 mg/dL (ref 1.7–2.4)

## 2015-09-06 LAB — BASIC METABOLIC PANEL
Anion gap: 8 (ref 5–15)
BUN: 12 mg/dL (ref 6–20)
CALCIUM: 8.3 mg/dL — AB (ref 8.9–10.3)
CHLORIDE: 99 mmol/L — AB (ref 101–111)
CO2: 21 mmol/L — ABNORMAL LOW (ref 22–32)
CREATININE: 1.13 mg/dL (ref 0.61–1.24)
Glucose, Bld: 96 mg/dL (ref 65–99)
Potassium: 2.9 mmol/L — ABNORMAL LOW (ref 3.5–5.1)
SODIUM: 128 mmol/L — AB (ref 135–145)

## 2015-09-06 LAB — CBC
HEMATOCRIT: 35.7 % — AB (ref 39.0–52.0)
Hemoglobin: 12.8 g/dL — ABNORMAL LOW (ref 13.0–17.0)
MCH: 30 pg (ref 26.0–34.0)
MCHC: 35.9 g/dL (ref 30.0–36.0)
MCV: 83.6 fL (ref 78.0–100.0)
PLATELETS: 200 10*3/uL (ref 150–400)
RBC: 4.27 MIL/uL (ref 4.22–5.81)
RDW: 13.5 % (ref 11.5–15.5)
WBC: 8.5 10*3/uL (ref 4.0–10.5)

## 2015-09-06 MED ORDER — AMLODIPINE BESYLATE 5 MG PO TABS: 5.0000 mg | ORAL_TABLET | Freq: Every day | ORAL | 0 refills | Status: DC

## 2015-09-06 MED ORDER — POTASSIUM CHLORIDE 2 MEQ/ML IV SOLN
INTRAVENOUS | Status: DC
  Administered 2015-09-06 – 2015-09-07 (×2): via INTRAVENOUS
  Filled 2015-09-06 (×3): qty 1000

## 2015-09-06 MED ORDER — POTASSIUM CHLORIDE CRYS ER 20 MEQ PO TBCR
40.0000 meq | EXTENDED_RELEASE_TABLET | ORAL | Status: AC
  Administered 2015-09-06 (×2): 40 meq via ORAL
  Filled 2015-09-06 (×2): qty 2

## 2015-09-06 MED ORDER — ENSURE ENLIVE PO LIQD: 237.0000 mL | Freq: Two times a day (BID) | ORAL | 12 refills | Status: AC

## 2015-09-06 MED ORDER — TAMSULOSIN HCL 0.4 MG PO CAPS
0.4000 mg | ORAL_CAPSULE | Freq: Every day | ORAL | Status: DC
  Administered 2015-09-06: 0.4 mg via ORAL
  Filled 2015-09-06: qty 1

## 2015-09-06 MED ORDER — CLINDAMYCIN HCL 300 MG PO CAPS
300.0000 mg | ORAL_CAPSULE | Freq: Four times a day (QID) | ORAL | Status: DC
  Administered 2015-09-06 – 2015-09-07 (×4): 300 mg via ORAL
  Filled 2015-09-06 (×4): qty 1

## 2015-09-06 MED ORDER — POTASSIUM CHLORIDE ER 20 MEQ PO TBCR: 20.0000 meq | EXTENDED_RELEASE_TABLET | Freq: Every day | ORAL | 0 refills | Status: DC

## 2015-09-06 NOTE — Progress Notes (Signed)
Initial Nutrition Assessment  DOCUMENTATION CODES:   Severe malnutrition in context of acute illness/injury  INTERVENTION:   Provided protein supplement options for patient to try at home Discussed healthy plant-based proteins with patient Encouraged small frequent meals  NUTRITION DIAGNOSIS:   Inadequate oral intake related to poor appetite, nausea as evidenced by per patient/family report.  GOAL:   Patient will meet greater than or equal to 90% of their needs  MONITOR:   PO intake, Labs, Weight trends, I & O's  REASON FOR ASSESSMENT:   Consult Assessment of nutrition requirement/status  ASSESSMENT:   70 y.o.malewith past medical history significant for hypertension, recent tooth extraction two weeks PTA for tooth abscess who presented with generalized weakness and headache for past 6 days PTA. He was seen in ED and had CT-head on 09/02/15 which showed nonspecific patchy areas of low density within the white matter bilaterally. He still has headache, constant, 6/10 in intensity, non radiating, alleviated by laying down. Patient reported generalized weakness.  Pt reports improved appetite today, he ate 50% of his breakfast of toast and peaches. Pt states that since a tooth extraction 2 weeks PTA, his appetite has been poor and he has been subsisting on bread, applesauce and yogurt. Pt states he was experiencing some nausea and headaches. Pt denies chewing difficulties after the tooth extraction and no swallowing issues. Pt was on antibiotics but denies having loose stools except for 1 time. Pt states that the thought of food would make him sick. He vomited after eating some bread on 8/4.  Pt expected to discharge this afternoon. Reviewed protein supplement options for him to try at home. Encouraged him to consume at least 2 supplements a day in between meals. Encouraged small frequent meals. Pt reports not eating meat. Reviewed dairy foods and plant-based protein foods to include in  his diet.   Pt reports UBW of 140 lb. Wt is currently stable.  Nutrition-Focused physical exam completed. Findings are moderate fat depletion, severe muscle depletion, and no edema.   Medications reviewed. Labs reviewed: Low Na -trending up Low K Mg WNL  Diet Order:  Diet Heart Room service appropriate? Yes; Fluid consistency: Thin  Skin:  Reviewed, no issues  Last BM:  PTA  Height:   Ht Readings from Last 1 Encounters:  09/05/15 5\' 5"  (1.651 m)    Weight:   Wt Readings from Last 1 Encounters:  09/05/15 140 lb (63.5 kg)    Ideal Body Weight:  61.8 kg  BMI:  Body mass index is 23.3 kg/m.  Estimated Nutritional Needs:   Kcal:  1750-1950  Protein:  70-80g  Fluid:  1.9L/day  EDUCATION NEEDS:   Education needs addressed  Tilda FrancoLindsey Dorrien Grunder, MS, RD, LDN Pager: [phone] After Hours Pager: [phone]

## 2015-09-06 NOTE — Discharge Instructions (Signed)
Hyponatremia °Hyponatremia is when the amount of salt (sodium) in your blood is too low. When salt levels are low, your cells absorb extra water and they swell. The swelling happens throughout the body, but it mostly affects the brain.  °HOME CARE °· Take medicines only as told by your doctor. Many medicines can make this condition worse. Talk with your doctor about any medicines that you are currently taking. °· Carefully follow a recommended diet as told by your doctor. °· Carefully follow instructions from your doctor about fluid restrictions. °· Keep all follow-up visits as told by your doctor. This is important. °· Do not drink alcohol. °GET HELP IF: °· You feel sicker to your stomach (nauseous). °· You feel more confused. °· You feel more tired (fatigued). °· Your headache gets worse. °· You feel weaker. °· Your symptoms go away and then they come back. °· You have trouble following the diet instructions. °GET HELP RIGHT AWAY IF: °· You start to twitch and shake (have a seizure). °· You pass out (faint). °· You keep having watery poop (diarrhea). °· You keep throwing up (vomiting). °  °This information is not intended to replace advice given to you by your health care provider. Make sure you discuss any questions you have with your health care provider. °  °Document Released: 09/26/2010 Document Revised: 05/31/2014 Document Reviewed: 01/10/2014 °Elsevier Interactive Patient Education ©2016 [employer] ° °

## 2015-09-06 NOTE — Progress Notes (Signed)
Occupational Therapy Evaluation Patient Details Name: Brandon Ho MRN: [id number] DOB: [birth year] Today's Date: 09/06/2015    History of Present Illness 70 y.o. male with medical history significant of hypertension, history of irregular heartbeat, recent tooth extraction two weeks ago for an abscessed tooth, who presents with generalized weakness and headache   Clinical Impression   Patient presents to OT with no further OT needs at this time. Patient very talkative about dealing with the tooth abscess/extraction/after care and provided emotional support. Patient modified independent with BADLs at this time. OT will sign off.    Follow Up Recommendations  No OT follow up    Equipment Recommendations  None recommended by OT    Recommendations for Other Services       Precautions / Restrictions Precautions Precautions: Fall Precaution Comments: recent fall while putting on pants in standing Restrictions Weight Bearing Restrictions: No      Mobility Bed Mobility               General bed mobility comments: NT -- up in recliner  Transfers Overall transfer level: Modified independent Equipment used: None Transfers: Sit to/from Stand Sit to Stand: Modified independent (Device/Increase time)              Balance                                            ADL Overall ADL's : Modified independent                                       General ADL Comments: Patient doing well with BADLs in hospital setting. Reports 1 fall at home while putting on trousers in standing. Educated patient to begin donning pants/underwear in sitting. Patient verbalized understanding.     Vision     Perception     Praxis      Pertinent Vitals/Pain Pain Assessment: No/denies pain     Hand Dominance     Extremity/Trunk Assessment Upper Extremity Assessment Upper Extremity Assessment: Overall WFL for tasks assessed   Lower Extremity  Assessment Lower Extremity Assessment: Defer to PT evaluation       Communication Communication Communication: No difficulties   Cognition Arousal/Alertness: Awake/alert Behavior During Therapy: WFL for tasks assessed/performed Overall Cognitive Status: Within Functional Limits for tasks assessed                     General Comments       Exercises       Shoulder Instructions      Home Living Family/patient expects to be discharged to:: Private residence Living Arrangements: Alone   Type of Home: House Home Access: Stairs to enter Entergy CorporationEntrance Stairs-Number of Steps: " a few" Entrance Stairs-Rails: Right Home Layout: One level     Bathroom Shower/Tub: Producer, television/film/videoWalk-in shower   Bathroom Toilet: Standard     Home Equipment: Cane - single point          Prior Functioning/Environment Level of Independence: Independent        Comments: drives, 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    OT Diagnosis: Generalized weakness   OT Problem List: Decreased strength;Decreased activity tolerance;Impaired balance (sitting and/or standing)   OT Treatment/Interventions:      OT Goals(Current goals can be  found in the care plan section) Acute Rehab OT Goals Patient Stated Goal: to go home OT Goal Formulation: All assessment and education complete, DC therapy  OT Frequency:     Barriers to D/C:            Co-evaluation              End of Session    Activity Tolerance: Patient tolerated treatment well Patient left: in chair;with call bell/phone within reach;with chair alarm set   Time: 1610-9604 OT Time Calculation (min): 25 min Charges:  OT General Charges $OT Visit: 1 Procedure OT Evaluation $OT Eval Low Complexity: 1 Procedure OT Treatments $Self Care/Home Management : 8-22 mins G-Codes:    Brandon Ho A 10/04/15, 9:36 AM

## 2015-09-06 NOTE — Discharge Summary (Addendum)
Physician Discharge Summary  Brandon Ho FAO:[id number] DOB: 01-Aug-1945 DOA: 09/04/2015  PCP: Lucilla Edin, MD  Admit date: 09/04/2015 Discharge date: 09/07/2015  Admitted From: Home Disposition:  Home  Recommendations for Outpatient Follow-up:  1. Follow up with PCP in 1-2 weeks. Complete antibiotics on 09/11/2015. (Prescribed by his dentist) 2. Please recheck serum sodium and potassium during outpatient follow-up. 3. Please recheck thyroid function in 4-6 weeks.  Home Health: None Equipment/Devices: None  Discharge Condition:Stable CODE STATUS:Full code Diet recommendation: Heart Healthy With supplements     Discharge Diagnoses:  Principal Problem:   Dehydration with hyponatremia   Active Problems:   Essential hypertension   Abnormal CT scan of head   Generalized weakness   Hyponatremia   Headache   Protein-calorie malnutrition, severe   Hypokalemia   Tooth abscess  Brief narrative/history of present illness 70 year old male with hypertension, recent tooth extraction 2 weeks back for tooth abscess and on different antibiotic regimen was sent to the ED by his PCP for generalized weakness and headache for almost 5-60s prior to admission. He was seen in the ED had a head CT which showed nonspecific patchy areas of low density within bilateral white matter. Patient was found to have a sodium of 114 on admission and admitted to telemetry on IV fluids. Serum osmolality was 249 with urine sodium 24 and urine osmolality of 156.  Principal problem Dehydration with hypo-osmolar hyponatremia Low serum and urine osmolality. Suspect dehydration with poor by mouth intake and concomitant use of HCTZ. Improved with IV hydration and discontinuing his HCTZ. Sodium of 129 this morning. No further headache or dizziness. Stable for discharge home and patient encouraged on adequate hydration. Instructed to follow-up with his PCP within one week and his labs should be  repeated.   Hypokalemia Secondary to dehydration and poor by mouth intake. HCTZ discontinued. Replenished with KCl. Prescribed some supplement upon discharge. potassium improved to 4.7 this am.  Essential hypertension Discontinue HCTZ and started on low-dose amlodipine. Continue propanolol.  Recent tooth abscess Patient prescribed clindamycin by his PCP (took only for 1 day before coming to the hospital). Instructed to resume.  Elevated TSH 6.19. Free T4 also mildly elevated. I doubt this is contributing to his hyponatremia. Should be repeated in 4 weeks.  Severe protein calorie malnutrition Prescribed supplements. Encouraged by mouth intake.  Abnormal head CT Showed patchy bilateral white matter changes. MRI of the brain done showed chronic ischemic changes and mild brain atrophy.  BPH with urinary retention Bladder scan showing >600cc. Placed on flomax and now able to void without difficulty.  Patient stable for discharge home with outpatient PCP follow-up.  Discharge Instructions     Medication List    STOP taking these medications   amoxicillin 500 MG capsule Commonly known as:  AMOXIL   hydrochlorothiazide 12.5 MG tablet Commonly known as:  HYDRODIURIL     TAKE these medications   amLODipine 5 MG tablet Commonly known as:  NORVASC Take 1 tablet (5 mg total) by mouth daily.   chlorhexidine 0.12 % solution Commonly known as:  PERIDEX Use as directed 15 mLs in the mouth or throat 2 (two) times daily.   clindamycin 300 MG capsule Commonly known as:  CLEOCIN Take 300 mg by mouth every 6 (six) hours. X 7 days. Patient hasn't started yet   feeding supplement (ENSURE ENLIVE) Liqd Take 237 mLs by mouth 2 (two) times daily between meals.   Cap tamsulosin ( flomax ) 0.4 mg  Take  1 capsule daily after supper Qty: 30 capsules, no refull   ondansetron 4 MG disintegrating tablet Commonly known as:  ZOFRAN ODT Take 1 tablet (4 mg total) by mouth every 8 (eight)  hours as needed for nausea or vomiting.   Potassium Chloride ER 20 MEQ Tbcr Take 20 mEq by mouth daily.   propranolol 40 MG tablet Commonly known as:  INDERAL Take 40 mg by mouth 2 (two) times daily.      Follow-up Information    DAUB, STEVE A, MD. Schedule an appointment as soon as possible for a visit in 1 week(s).   Specialty:  Family Medicine Contact information: [address] [place] [address] [phone]          Allergies  Allergen Reactions  . Mouth Rinse [Cetylpyridinium Chloride]     Facial tingling     Consultations:  None   Procedures/Studies: Dg Chest 2 View  Result Date: 08/31/2015 CLINICAL DATA:  Worsening nausea for the past 4 days with increasing severity over the past 2 days. No oral ingestion for 2 days. EXAM: CHEST  2 VIEW COMPARISON:  None in PACs FINDINGS: The lungs are mildly hyperinflated with hemidiaphragm flattening and increased AP dimension of the thorax. There is an azygos lobe anatomy on the right. There is no alveolar pneumonia. The heart and pulmonary vascularity are normal. There is calcification in the wall of the aortic arch. There is no pleural effusion or pneumothorax. The bony thorax exhibits no acute abnormality. The gas pattern in the upper abdomen is unremarkable. IMPRESSION: COPD.  No pneumonia nor CHF. Aortic atherosclerosis. Electronically Signed   By: David  Swazil[place]Jordan M.D.   On: 08/31/2015 10:34   Dg Cervical Spine Complete  Result Date: 09/02/2015 CLINICAL DATA:  Head and neck pain.  Fall 2 days ago. EXAM: CERVICAL SPINE - COMPLETE 4+ VIEW COMPARISON:  None. FINDINGS: The pre odontoid space and prevertebral soft tissues are normal. No traumatic malalignment identified. No fractures are noted. Scattered degenerative changes. No significant neural foraminal narrowing. The lateral masses of C1 are not well assessed versus C2. Very limited views of the odontoid process. The upper chest is unremarkable. IMPRESSION: 1. Limited views  of the odontoid process. No fracture or malalignment seen within this limitation. Electronically Signed   By: Gerome Samavid  Williams III M.D   On: 09/02/2015 11:40   Ct Head Wo Contrast  Result Date: 09/02/2015 CLINICAL DATA:  Headaches, extreme fatigue last night in this morning. EXAM: CT HEAD WITHOUT CONTRAST TECHNIQUE: Contiguous axial images were obtained from the base of the skull through the vertex without intravenous contrast. COMPARISON:  None. FINDINGS: Brain: Patchy areas of low density within the bilateral periventricular and subcortical white matter regions, most suggestive of chronic small vessel ischemic change. There is no mass, hemorrhage, edema or other evidence of acute parenchymal abnormality. No extra-axial hemorrhage. Vascular: No hyperdense vessel or unexpected calcification. There are chronic calcified atherosclerotic changes of the large vessels at the skull base. Skull: Negative for fracture or focal lesion. Sinuses/Orbits: No acute findings. Other: None. IMPRESSION: 1. Patchy areas of low density within the white matter bilaterally, nonspecific, likely related to chronic small vessel ischemia. 2. No acute findings.  No intracranial mass or hemorrhage. Electronically Signed   By: Bary RichardStan  Maynard M.D.   On: 09/02/2015 13:00   Mr Brain Wo Contrast  Result Date: 09/05/2015 CLINICAL DATA:  Headache EXAM: MRI HEAD WITHOUT CONTRAST TECHNIQUE: Multiplanar, multiecho pulse sequences of the brain and surrounding structures were obtained without  intravenous contrast. COMPARISON:  CT head 09/02/2015 FINDINGS: Mild ventricular enlargement due to atrophy.  Mild cerebral atrophy. Negative for acute infarct. Numerous hyperintense areas are present throughout the cerebral white matter bilaterally. Hyperintense lesions in the periventricular and deep white matter diffusely. Basal ganglia and brainstem normal. Negative for intracranial hemorrhage Negative for mass or edema.  No shift of the midline structures.  Circle of Willis patent. Paranasal sinuses clear. No orbital lesion. Bilateral cataract surgery. Pituitary not enlarged. Calvarium intact. IMPRESSION: Atrophy and moderate chronic microvascular ischemic changes throughout the white matter. No acute abnormality. Electronically Signed   By: Marlan Palau M.D.   On: 09/05/2015 15:24       Subjective: Denies any headache or dizziness.  Discharge Exam: Vitals:   09/06/15 0411 09/06/15 1312  BP: 120/68 131/76  Pulse: 74 61  Resp: 17 18  Temp: 98.1 F (36.7 C) 98.6 F (37 C)   Vitals:   09/05/15 1311 09/05/15 2153 09/06/15 0411 09/06/15 1312  BP: (!) 156/73 (!) 158/82 120/68 131/76  Pulse: 82 71 74 61  Resp: 18 18 17 18   Temp: 98 F (36.7 C) 97.8 F (36.6 C) 98.1 F (36.7 C) 98.6 F (37 C)  TempSrc: Oral Oral Oral Oral  SpO2: 99% 100% 96% 100%  Weight:      Height:        General: Elderly thin built male not in distress HEENT: Temporal wasting, moist mucosa, supple neck, left upper molar area without any signs of abscess. Multiple caried teeth Chest: PA bilaterally, no added sounds Cardiovascular: RRR, S1/S2 +, no rubs, no gallops Abdominal: Soft, NT, ND, bowel sounds + Extremities: Warm, no edema CNS: Alert and oriented    The results of significant diagnostics from this hospitalization (including imaging, microbiology, ancillary and laboratory) are listed below for reference.     Microbiology: No results found for this or any previous visit (from the past 240 hour(s)).   Labs: BNP (last 3 results) No results for input(s): BNP in the last 8760 hours. Basic Metabolic Panel:  Recent Labs Lab 09/04/15 2300 09/05/15 0159 09/05/15 0734 09/05/15 1338 09/06/15 0515  NA 114* 120* 124* 127* 128*  K 3.7 2.8* 2.7* 3.1* 2.9*  CL 82* 90* 93* 96* 99*  CO2 20* 20* 22 23 21*  GLUCOSE 106* 104* 103* 112* 96  BUN 17 15 12 11 12   CREATININE 1.18 1.09 1.12 1.15 1.13  CALCIUM 8.5* 7.6* 7.9* 8.3* 8.3*  MG  --   --   --    --  2.1   Liver Function Tests:  Recent Labs Lab 09/04/15 2300  AST 63*  ALT 37  ALKPHOS 59  BILITOT 1.6*  PROT 7.6  ALBUMIN 4.1   No results for input(s): LIPASE, AMYLASE in the last 168 hours. No results for input(s): AMMONIA in the last 168 hours. CBC:  Recent Labs Lab 08/31/15 1040 09/04/15 2300 09/05/15 0159 09/06/15 0515  WBC 9.8 10.8* 9.8 8.5  NEUTROABS  --  7.4  --   --   HGB 14.3 13.5 12.6* 12.8*  HCT 41.5* 35.2* 33.4* 35.7*  MCV 88.3 79.6 80.9 83.6  PLT  --  189 188 200   Cardiac Enzymes:  Recent Labs Lab 08/31/15 1031  TROPONINI 0.01   BNP: Invalid input(s): POCBNP CBG:  Recent Labs Lab 09/04/15 2310  GLUCAP 117*   D-Dimer No results for input(s): DDIMER in the last 72 hours. Hgb A1c No results for input(s): HGBA1C in the last 72 hours.  Lipid Profile No results for input(s): CHOL, HDL, LDLCALC, TRIG, CHOLHDL, LDLDIRECT in the last 72 hours. Thyroid function studies  Recent Labs  09/05/15 0159  TSH 6.195*   Anemia work up No results for input(s): VITAMINB12, FOLATE, FERRITIN, TIBC, IRON, RETICCTPCT in the last 72 hours. Urinalysis    Component Value Date/Time   COLORURINE YELLOW 09/05/2015 0443   APPEARANCEUR CLEAR 09/05/2015 0443   LABSPEC 1.006 09/05/2015 0443   PHURINE 6.5 09/05/2015 0443   GLUCOSEU NEGATIVE 09/05/2015 0443   HGBUR TRACE (A) 09/05/2015 0443   BILIRUBINUR NEGATIVE 09/05/2015 0443   KETONESUR 15 (A) 09/05/2015 0443   PROTEINUR NEGATIVE 09/05/2015 0443   NITRITE NEGATIVE 09/05/2015 0443   LEUKOCYTESUR NEGATIVE 09/05/2015 0443   Sepsis Labs Invalid input(s): PROCALCITONIN,  WBC,  LACTICIDVEN Microbiology No results found for this or any previous visit (from the past 240 hour(s)).   Time coordinating discharge: Over 30 minutes  SIGNED:   Eddie North, MD  [facility] 09/06/2015, 3:14 PM Pager   If 7PM-7AM, please contact night-coverage [email or web address] Password [email or web address]

## 2015-09-06 NOTE — Progress Notes (Signed)
Pt unable to void, bladder scanned, 621 cc noted, MD notified. Discharged cancelled and orders received.SRP, RN

## 2015-09-06 NOTE — Progress Notes (Signed)
Patient unable to void since last night with bladder scan showing about 600 mL urine. Ordered for straight cath and started on daily Flomax. Discharge held and patient started on IV hydration with potassium supplement. reassess in the morning .

## 2015-09-07 ENCOUNTER — Telehealth: Payer: Self-pay | Admitting: Emergency Medicine

## 2015-09-07 DIAGNOSIS — N400 Enlarged prostate without lower urinary tract symptoms: Secondary | ICD-10-CM

## 2015-09-07 LAB — CBC
HEMATOCRIT: 34.7 % — AB (ref 39.0–52.0)
Hemoglobin: 12.3 g/dL — ABNORMAL LOW (ref 13.0–17.0)
MCH: 30.4 pg (ref 26.0–34.0)
MCHC: 35.4 g/dL (ref 30.0–36.0)
MCV: 85.7 fL (ref 78.0–100.0)
Platelets: 176 10*3/uL (ref 150–400)
RBC: 4.05 MIL/uL — ABNORMAL LOW (ref 4.22–5.81)
RDW: 13.8 % (ref 11.5–15.5)
WBC: 8.9 10*3/uL (ref 4.0–10.5)

## 2015-09-07 LAB — BASIC METABOLIC PANEL
Anion gap: 4 — ABNORMAL LOW (ref 5–15)
BUN: 11 mg/dL (ref 6–20)
CALCIUM: 8 mg/dL — AB (ref 8.9–10.3)
CO2: 20 mmol/L — AB (ref 22–32)
CREATININE: 1.09 mg/dL (ref 0.61–1.24)
Chloride: 105 mmol/L (ref 101–111)
GFR calc non Af Amer: >60 mL/min (ref >60)
Glucose, Bld: 96 mg/dL (ref 65–99)
Potassium: 4.7 mmol/L (ref 3.5–5.1)
SODIUM: 129 mmol/L — AB (ref 135–145)

## 2015-09-07 MED ORDER — TAMSULOSIN HCL 0.4 MG PO CAPS: 0.4000 mg | ORAL_CAPSULE | Freq: Every day | ORAL | 0 refills | Status: DC

## 2015-09-07 NOTE — Telephone Encounter (Signed)
I spoke with patient. His diagnosis of hyponatremia and hypokalemia was felt to be secondary to his diuretic as well as decreased by mouth intake. He is now home on oral potassium supplement. He will return to clinic in 8-9 days for repeat electrolytes as well as thyroid studies. He is also going to be on his antibiotics and blood pressure medication change to amlodipine with Flomax for decreased urinary stream.

## 2015-09-07 NOTE — Progress Notes (Signed)
PROGRESS NOTE                                                                                                                                                                                                             Patient Demographics:    Brandon Ho, is a 70 y.o. male, DOB - [birth year], ZOX:[id number]  Admit date - 09/04/2015   Admitting Physician Lorretta Harp, MD  Outpatient Primary MD for the patient is DAUB, [name], MD  LOS - 2  Outpatient Specialists:none  Chief Complaint  Patient presents with  . Weakness       Brief Narrative   70 year old male with hypertension, recent tooth extraction 2 weeks back for tooth abscess and on different antibiotic regimen was sent to the ED by his PCP for generalized weakness and headache for almost 5-60s prior to admission. Found to have severe hyponatremia.   Subjective:   Reports being able to void without difficulty   Assessment  & Plan :   Dehydration with hypo-osmolar hyponatremia Suspect dehydration with poor by mouth intake and concomitant use of HCTZ. Improved with IV hydration and discontinuing his HCTZ. Sodium of 129 this morning. No further headache or dizziness. Stable for discharge home and patient encouraged on adequate hydration.    Hypokalemia Secondary to dehydration and poor by mouth intake. HCTZ discontinued. Replenished with KCl. Prescribed some supplement upon discharge. potassium improved to 4.7 this am.  Essential hypertension Discontinue HCTZ and started on low-dose amlodipine. Continue propanolol.  BPH with urinary retention Bladder scan showing >600cc. Placed on flomax and now able to void without difficulty.  Stable for d/c home  DVT Prophylaxis  :  Lovenox -   Lab Results  Component Value Date   PLT 176 09/07/2015    Antibiotics  :    Anti-infectives    Start     Dose/Rate Route Frequency Ordered Stop   09/06/15 1500   clindamycin (CLEOCIN) capsule 300 mg     300 mg Oral Every 6 hours 09/06/15 1337 09/13/15 1759        Objective:   Vitals:   09/06/15 2103 09/07/15 0527 09/07/15 0937 09/07/15 0938  BP: (!) 150/77 (!) 145/51 (!) 140/58   Pulse: 91 70  72  Resp: 18 18    Temp: 97.4 F (36.3 C) 97.6 F (36.4 C)  TempSrc: Oral Oral    SpO2: 100% 100%    Weight:      Height:        Wt Readings from Last 3 Encounters:  09/05/15 63.5 kg (140 lb)  09/05/15 63.5 kg (140 lb)  09/02/15 63 kg (139 lb)     Intake/Output Summary (Last 24 hours) at 09/07/15 1043 Last data filed at 09/07/15 0900  Gross per 24 hour  Intake          2546.67 ml  Output             1450 ml  Net          1096.67 ml     Physical Exam  Gen: not in distress HEENT:, moist mucosa, supple neck Chest: clear b/l, no added sounds CVS: N S1&S2,  GI: soft, NT, ND,  Musculoskeletal: warm, no edema     Data Review:    CBC  Recent Labs Lab 09/04/15 2300 09/05/15 0159 09/06/15 0515 09/07/15 0444  WBC 10.8* 9.8 8.5 8.9  HGB 13.5 12.6* 12.8* 12.3*  HCT 35.2* 33.4* 35.7* 34.7*  PLT 189 188 200 176  MCV 79.6 80.9 83.6 85.7  MCH 30.5 30.5 30.0 30.4  MCHC 38.4* 37.7* 35.9 35.4  RDW 12.8 12.9 13.5 13.8  LYMPHSABS 2.0  --   --   --   MONOABS 1.4*  --   --   --   EOSABS 0.0  --   --   --   BASOSABS 0.0  --   --   --     Chemistries   Recent Labs Lab 09/04/15 2300 09/05/15 0159 09/05/15 0734 09/05/15 1338 09/06/15 0515 09/07/15 0444  NA 114* 120* 124* 127* 128* 129*  K 3.7 2.8* 2.7* 3.1* 2.9* 4.7  CL 82* 90* 93* 96* 99* 105  CO2 20* 20* 22 23 21* 20*  GLUCOSE 106* 104* 103* 112* 96 96  BUN 17 15 12 11 12 11   CREATININE 1.18 1.09 1.12 1.15 1.13 1.09  CALCIUM 8.5* 7.6* 7.9* 8.3* 8.3* 8.0*  MG  --   --   --   --  2.1  --   AST 63*  --   --   --   --   --   ALT 37  --   --   --   --   --   ALKPHOS 59  --   --   --   --   --   BILITOT 1.6*  --   --   --   --   --     ------------------------------------------------------------------------------------------------------------------ No results for input(s): CHOL, HDL, LDLCALC, TRIG, CHOLHDL, LDLDIRECT in the last 72 hours.  No results found for: HGBA1C ------------------------------------------------------------------------------------------------------------------  Recent Labs  09/05/15 0159  TSH 6.195*   ------------------------------------------------------------------------------------------------------------------ No results for input(s): VITAMINB12, FOLATE, FERRITIN, TIBC, IRON, RETICCTPCT in the last 72 hours.  Coagulation profile No results for input(s): INR, PROTIME in the last 168 hours.  No results for input(s): DDIMER in the last 72 hours.  Cardiac Enzymes No results for input(s): CKMB, TROPONINI, MYOGLOBIN in the last 168 hours.  Invalid input(s): CK ------------------------------------------------------------------------------------------------------------------ No results found for: BNP  Inpatient Medications  Scheduled Meds: . amLODipine  5 mg Oral Daily  . clindamycin  300 mg Oral Q6H  . enoxaparin (LOVENOX) injection  40 mg Subcutaneous Q24H  . propranolol  40 mg Oral BID  . sodium chloride flush  3 mL Intravenous Q12H  . tamsulosin  0.4  mg Oral QPC supper   Continuous Infusions: . sodium chloride 0.9 % 1,000 mL with potassium chloride 40 mEq infusion 100 mL/hr at 09/07/15 0327   PRN Meds:.acetaminophen **OR** acetaminophen, hydrALAZINE, ondansetron  Micro Results No results found for this or any previous visit (from the past 240 hour(s)).  Radiology Reports Dg Chest 2 View  Result Date: 08/31/2015 CLINICAL DATA:  Worsening nausea for the past 4 days with increasing severity over the past 2 days. No oral ingestion for 2 days. EXAM: CHEST  2 VIEW COMPARISON:  None in PACs FINDINGS: The lungs are mildly hyperinflated with hemidiaphragm flattening and increased AP  dimension of the thorax. There is an azygos lobe anatomy on the right. There is no alveolar pneumonia. The heart and pulmonary vascularity are normal. There is calcification in the wall of the aortic arch. There is no pleural effusion or pneumothorax. The bony thorax exhibits no acute abnormality. The gas pattern in the upper abdomen is unremarkable. IMPRESSION: COPD.  No pneumonia nor CHF. Aortic atherosclerosis. Electronically Signed   By: David  [place]il[place]Jordan M.D.   On: 08/31/2015 10:34   Dg Cervical Spine Complete  Result Date: 09/02/2015 CLINICAL DATA:  Head and neck pain.  Fall 2 days ago. EXAM: CERVICAL SPINE - COMPLETE 4+ VIEW COMPARISON:  None. FINDINGS: The pre odontoid space and prevertebral soft tissues are normal. No traumatic malalignment identified. No fractures are noted. Scattered degenerative changes. No significant neural foraminal narrowing. The lateral masses of C1 are not well assessed versus C2. Very limited views of the odontoid process. The upper chest is unremarkable. IMPRESSION: 1. Limited views of the odontoid process. No fracture or malalignment seen within this limitation. Electronically Signed   By: Gerome Samavid  Williams III M.D   On: 09/02/2015 11:40   Ct Head Wo Contrast  Result Date: 09/02/2015 CLINICAL DATA:  Headaches, extreme fatigue last night in this morning. EXAM: CT HEAD WITHOUT CONTRAST TECHNIQUE: Contiguous axial images were obtained from the base of the skull through the vertex without intravenous contrast. COMPARISON:  None. FINDINGS: Brain: Patchy areas of low density within the bilateral periventricular and subcortical white matter regions, most suggestive of chronic small vessel ischemic change. There is no mass, hemorrhage, edema or other evidence of acute parenchymal abnormality. No extra-axial hemorrhage. Vascular: No hyperdense vessel or unexpected calcification. There are chronic calcified atherosclerotic changes of the large vessels at the skull base. Skull: Negative  for fracture or focal lesion. Sinuses/Orbits: No acute findings. Other: None. IMPRESSION: 1. Patchy areas of low density within the white matter bilaterally, nonspecific, likely related to chronic small vessel ischemia. 2. No acute findings.  No intracranial mass or hemorrhage. Electronically Signed   By: Bary RichardStan  Maynard M.D.   On: 09/02/2015 13:00   Mr Brain Wo Contrast  Result Date: 09/05/2015 CLINICAL DATA:  Headache EXAM: MRI HEAD WITHOUT CONTRAST TECHNIQUE: Multiplanar, multiecho pulse sequences of the brain and surrounding structures were obtained without intravenous contrast. COMPARISON:  CT head 09/02/2015 FINDINGS: Mild ventricular enlargement due to atrophy.  Mild cerebral atrophy. Negative for acute infarct. Numerous hyperintense areas are present throughout the cerebral white matter bilaterally. Hyperintense lesions in the periventricular and deep white matter diffusely. Basal ganglia and brainstem normal. Negative for intracranial hemorrhage Negative for mass or edema.  No shift of the midline structures. Circle of Willis patent. Paranasal sinuses clear. No orbital lesion. Bilateral cataract surgery. Pituitary not enlarged. Calvarium intact. IMPRESSION: Atrophy and moderate chronic microvascular ischemic changes throughout the white matter. No acute abnormality. Electronically  Signed   By: Marlan Palau M.D.   On: 09/05/2015 15:24    Time Spent in minutes  25   Eddie North M.D on 09/07/2015 at 10:43 AM  Between 7am to 7pm - Pager - [phone]  After 7pm go to [email or web address] - password [facility]  [facility] -  Office  [phone]

## 2015-09-08 ENCOUNTER — Telehealth: Payer: Self-pay | Admitting: Emergency Medicine

## 2015-09-08 ENCOUNTER — Encounter (HOSPITAL_COMMUNITY): Payer: Self-pay | Admitting: Emergency Medicine

## 2015-09-08 ENCOUNTER — Emergency Department (HOSPITAL_COMMUNITY)
Admission: EM | Admit: 2015-09-08 | Discharge: 2015-09-08 | Disposition: A | Discharge status: Home / Self Care | Payer: Medicare Other | Attending: Emergency Medicine | Admitting: Emergency Medicine

## 2015-09-08 DIAGNOSIS — Z79899 Other long term (current) drug therapy: Secondary | ICD-10-CM | POA: Insufficient documentation

## 2015-09-08 DIAGNOSIS — E871 Hypo-osmolality and hyponatremia: Secondary | ICD-10-CM

## 2015-09-08 LAB — CBC WITH DIFFERENTIAL/PLATELET
BASOS ABS: 0 10*3/uL (ref 0.0–0.1)
Basophils Relative: 0 %
EOS PCT: 2 %
Eosinophils Absolute: 0.1 10*3/uL (ref 0.0–0.7)
HCT: 37.9 % — ABNORMAL LOW (ref 39.0–52.0)
Hemoglobin: 13.4 g/dL (ref 13.0–17.0)
Lymphocytes Relative: 25 %
Lymphs Abs: 1.8 10*3/uL (ref 0.7–4.0)
MCH: 30.5 pg (ref 26.0–34.0)
MCHC: 35.4 g/dL (ref 30.0–36.0)
MCV: 86.1 fL (ref 78.0–100.0)
MONO ABS: 0.9 10*3/uL (ref 0.1–1.0)
Monocytes Relative: 12 %
Neutro Abs: 4.3 10*3/uL (ref 1.7–7.7)
Neutrophils Relative %: 61 %
PLATELETS: 205 10*3/uL (ref 150–400)
RBC: 4.4 MIL/uL (ref 4.22–5.81)
RDW: 13.6 % (ref 11.5–15.5)
WBC: 7.1 10*3/uL (ref 4.0–10.5)

## 2015-09-08 LAB — COMPREHENSIVE METABOLIC PANEL
ALBUMIN: 4.2 g/dL (ref 3.5–5.0)
ALT: 35 U/L (ref 17–63)
ANION GAP: 7 (ref 5–15)
AST: 35 U/L (ref 15–41)
Alkaline Phosphatase: 63 U/L (ref 38–126)
BILIRUBIN TOTAL: 0.9 mg/dL (ref 0.3–1.2)
BUN: 10 mg/dL (ref 6–20)
CHLORIDE: 99 mmol/L — AB (ref 101–111)
CO2: 23 mmol/L (ref 22–32)
Calcium: 9 mg/dL (ref 8.9–10.3)
Creatinine, Ser: 0.97 mg/dL (ref 0.61–1.24)
GFR calc Af Amer: >60 mL/min (ref >60)
GFR calc non Af Amer: >60 mL/min (ref >60)
GLUCOSE: 110 mg/dL — AB (ref 65–99)
POTASSIUM: 4.6 mmol/L (ref 3.5–5.1)
Sodium: 129 mmol/L — ABNORMAL LOW (ref 135–145)
TOTAL PROTEIN: 8 g/dL (ref 6.5–8.1)

## 2015-09-08 LAB — TSH: TSH: 4.117 u[IU]/mL (ref 0.350–4.500)

## 2015-09-08 MED ORDER — SODIUM CHLORIDE 0.9 % IV SOLN
INTRAVENOUS | Status: DC
  Administered 2015-09-08: 08:00:00 via INTRAVENOUS

## 2015-09-08 MED ORDER — SODIUM CHLORIDE 0.9 % IV BOLUS (SEPSIS)
1000.0000 mL | Freq: Once | INTRAVENOUS | Status: AC
  Administered 2015-09-08: 1000 mL via INTRAVENOUS

## 2015-09-08 NOTE — ED Notes (Signed)
Pt reports chills but a/o x 4.  Pt reports he was just here and discharged.

## 2015-09-08 NOTE — ED Triage Notes (Signed)
Pt complaint of continued weakness and chills post admission for hyponatremia 8/7.

## 2015-09-08 NOTE — Telephone Encounter (Signed)
Patient called at 545 stating he started to feel bad again through the night. His hands feel cold. He feels similar to how he felt prior to his admission to the hospital with hyponatremia and hypokalemia. I advised [facility] to [facility]. His neighbor will transport him. I [facility] and advised them that the patient was headed to the emergency room.

## 2015-09-08 NOTE — ED Notes (Addendum)
Pt ambulating in the hallway with no difficulties.

## 2015-09-08 NOTE — ED Provider Notes (Signed)
[facility]-EMERGENCY DEPT Provider Note   CSN: [id number] Arrival date & time: 09/08/15  8295  First Provider Contact:  None       History   Chief Complaint Chief Complaint  Patient presents with  . Abnormal Lab  . Weakness    HPI Brandon Ho is a 70 y.o. male.  70 year old male presents with weakness that began this morning. Was recently discharged from the hospital after admission for hyponatremia as well as hypokalemia felt to be due to dehydration from decreased oral intake as well as from use of diuretics. Discharge sodium yesterday was 129. States that he immediately got home yesterday and this morning began to feel weak. Denies any volume loss. Called his physician and was told to come here for repeat evaluation. Denies any fever or chills. No change in his urinary symptoms. No complaints of chest or abdominal discomfort. No treatment use prior to arrival      Past Medical History:  Diagnosis Date  . Irregular heart beat 1975  . Tooth abscess     Patient Active Problem List   Diagnosis Date Noted  . BPH (benign prostatic hyperplasia) 09/07/2015  . Protein-calorie malnutrition, severe 09/06/2015  . Hypokalemia 09/06/2015  . Dehydration with hyponatremia 09/06/2015  . Tooth abscess 09/06/2015  . Generalized weakness 09/05/2015  . Hyponatremia 09/05/2015  . Headache 09/05/2015  . Abnormal CT scan of head 09/02/2015  . Essential hypertension 08/31/2015    Past Surgical History:  Procedure Laterality Date  . CATARACT EXTRACTION W/ INTRAOCULAR LENS IMPLANT Bilateral        Home Medications    Prior to Admission medications   Medication Sig Start Date End Date Taking? Authorizing Provider  amLODipine (NORVASC) 5 MG tablet Take 1 tablet (5 mg total) by mouth daily. 09/06/15   Nishant Dhungel, MD  chlorhexidine (PERIDEX) 0.12 % solution Use as directed 15 mLs in the mouth or throat 2 (two) times daily. 08/30/15   Wallis Bamberg, PA-C  clindamycin (CLEOCIN) 300 MG  capsule Take 300 mg by mouth every 6 (six) hours. X 7 days. Patient hasn't started yet    Historical Provider, MD  feeding supplement, ENSURE ENLIVE, (ENSURE ENLIVE) LIQD Take 237 mLs by mouth 2 (two) times daily between meals. 09/06/15   Nishant Dhungel, MD  ondansetron (ZOFRAN ODT) 4 MG disintegrating tablet Take 1 tablet (4 mg total) by mouth every 8 (eight) hours as needed for nausea or vomiting. 09/02/15   Collene Gobble, MD  potassium chloride 20 MEQ TBCR Take 20 mEq by mouth daily. 09/06/15   Nishant Dhungel, MD  propranolol (INDERAL) 40 MG tablet Take 40 mg by mouth 2 (two) times daily.     Historical Provider, MD  tamsulosin (FLOMAX) 0.4 MG CAPS capsule Take 1 capsule (0.4 mg total) by mouth daily after supper. 09/07/15   Nishant Dhungel, MD    Family History Family History  Problem Relation Age of Onset  . Heart disease Mother   . Hypertension Mother   . Stroke Mother     Social History Social History  Substance Use Topics  . Smoking status: Never Smoker  . Smokeless tobacco: Never Used  . Alcohol use No     Allergies   Mouth rinse [cetylpyridinium chloride]   Review of Systems Review of Systems  All other systems reviewed and are negative.    Physical Exam Updated Vital Signs BP 177/93 (BP Location: Left Arm)   Pulse 77   Temp 97.5 F (36.4 C) (Oral)  Resp 16   SpO2 100%   Physical Exam  Constitutional: He is oriented to person, place, and time. He appears well-developed and well-nourished.  Non-toxic appearance. No distress.  HENT:  Head: Normocephalic and atraumatic.  Eyes: Conjunctivae, EOM and lids are normal. Pupils are equal, round, and reactive to light.  Neck: Normal range of motion. Neck supple. No tracheal deviation present. No thyroid mass present.  Cardiovascular: Normal rate, regular rhythm and normal heart sounds.  Exam reveals no gallop.   No murmur heard. Pulmonary/Chest: Effort normal and breath sounds normal. No stridor. No respiratory  distress. He has no decreased breath sounds. He has no wheezes. He has no rhonchi. He has no rales.  Abdominal: Soft. Normal appearance and bowel sounds are normal. He exhibits no distension. There is no tenderness. There is no rebound and no CVA tenderness.  Musculoskeletal: Normal range of motion. He exhibits no edema or tenderness.  Neurological: He is alert and oriented to person, place, and time. He has normal strength. No cranial nerve deficit or sensory deficit. GCS eye subscore is 4. GCS verbal subscore is 5. GCS motor subscore is 6.  Skin: Skin is warm and dry. No abrasion and no rash noted.  Psychiatric: He has a normal mood and affect. His speech is normal and behavior is normal.  Nursing note and vitals reviewed.    ED Treatments / Results  Labs (all labs ordered are listed, but only abnormal results are displayed) Labs Reviewed  CBC WITH DIFFERENTIAL/PLATELET  TSH  COMPREHENSIVE METABOLIC PANEL    EKG  EKG Interpretation None       Radiology No results found.  Procedures Procedures (including critical care time)  Medications Ordered in ED Medications  sodium chloride 0.9 % bolus 1,000 mL (not administered)  0.9 %  sodium chloride infusion (not administered)     Initial Impression / Assessment and Plan / ED Course  I have reviewed the triage vital signs and the nursing notes.  Pertinent labs & imaging results that were available during my care of the patient were reviewed by me and considered in my medical decision making (see chart for details).  Clinical Course    Patient's sodium remains stable at 129. Patient given 1 L of saline here. Thyroid studies within normal limits. Patient to follow-up with his own doctor.  Final Clinical Impressions(s) / ED Diagnoses   Final diagnoses:  None    New Prescriptions New Prescriptions   No medications on file     Lorre NickAnthony Cyndee Giammarco, MD 09/08/15 1018

## 2015-09-08 NOTE — Telephone Encounter (Signed)
See previous message

## 2015-09-09 ENCOUNTER — Telehealth: Payer: Self-pay | Admitting: Emergency Medicine

## 2015-09-09 NOTE — Telephone Encounter (Signed)
Patient called stating he had been constipated for the last week. I advised him to try MiraLAX or a fleets enema. He is also to force fluids. He was seen yesterday in the emergency room for follow-up blood work which returned improved from his recent hospitalization. He will follow-up in our office next week if not improving.

## 2015-09-18 ENCOUNTER — Ambulatory Visit (INDEPENDENT_AMBULATORY_CARE_PROVIDER_SITE_OTHER): Payer: Self-pay | Admitting: Emergency Medicine

## 2015-09-18 ENCOUNTER — Encounter: Payer: Self-pay | Admitting: Emergency Medicine

## 2015-09-18 VITALS — BP 120/72 | HR 80 | Temp 97.7°F | Resp 18 | Ht 65.0 in | Wt 135.0 lb

## 2015-09-18 DIAGNOSIS — Z125 Encounter for screening for malignant neoplasm of prostate: Secondary | ICD-10-CM

## 2015-09-18 DIAGNOSIS — Z1159 Encounter for screening for other viral diseases: Secondary | ICD-10-CM

## 2015-09-18 DIAGNOSIS — R519 Headache, unspecified: Secondary | ICD-10-CM

## 2015-09-18 DIAGNOSIS — E871 Hypo-osmolality and hyponatremia: Secondary | ICD-10-CM

## 2015-09-18 DIAGNOSIS — I10 Essential (primary) hypertension: Secondary | ICD-10-CM

## 2015-09-18 DIAGNOSIS — N400 Enlarged prostate without lower urinary tract symptoms: Secondary | ICD-10-CM

## 2015-09-18 DIAGNOSIS — Z114 Encounter for screening for human immunodeficiency virus [HIV]: Secondary | ICD-10-CM

## 2015-09-18 DIAGNOSIS — Z1211 Encounter for screening for malignant neoplasm of colon: Secondary | ICD-10-CM

## 2015-09-18 DIAGNOSIS — K047 Periapical abscess without sinus: Secondary | ICD-10-CM

## 2015-09-18 DIAGNOSIS — R51 Headache: Secondary | ICD-10-CM

## 2015-09-18 LAB — HIV ANTIBODY (ROUTINE TESTING W REFLEX): HIV 1&2 Ab, 4th Generation: NONREACTIVE

## 2015-09-18 LAB — POCT CBC
Granulocyte percent: 66.7 %G (ref 37–80)
HCT, POC: 35 % — AB (ref 43.5–53.7)
Hemoglobin: 12.6 g/dL — AB (ref 14.1–18.1)
LYMPH, POC: 1.8 (ref 0.6–3.4)
MCH: 31.1 pg (ref 27–31.2)
MCHC: 35.9 g/dL — AB (ref 31.8–35.4)
MCV: 86.5 fL (ref 80–97)
MID (CBC): 0.4 (ref 0–0.9)
MPV: 6.4 fL (ref 0–99.8)
PLATELET COUNT, POC: 214 10*3/uL (ref 142–424)
POC Granulocyte: 4.5 (ref 2–6.9)
POC LYMPH %: 26.6 % (ref 10–50)
POC MID %: 6.7 %M (ref 0–12)
RBC: 4.05 M/uL — AB (ref 4.69–6.13)
RDW, POC: 14 %
WBC: 6.7 10*3/uL (ref 4.6–10.2)

## 2015-09-18 LAB — BASIC METABOLIC PANEL WITH GFR
BUN: 20 mg/dL (ref 7–25)
CALCIUM: 8.9 mg/dL (ref 8.6–10.3)
CO2: 25 mmol/L (ref 20–31)
Chloride: 102 mmol/L (ref 98–110)
Creat: 1.16 mg/dL (ref 0.70–1.25)
GFR, EST AFRICAN AMERICAN: 74 mL/min (ref >=60)
GFR, Est Non African American: 64 mL/min (ref >=60)
GLUCOSE: 98 mg/dL (ref 65–99)
Potassium: 4.1 mmol/L (ref 3.5–5.3)
Sodium: 135 mmol/L (ref 135–146)

## 2015-09-18 LAB — PSA: PSA: 1.6 ng/mL (ref <=4.0)

## 2015-09-18 LAB — HEPATITIS C ANTIBODY: HCV Ab: NEGATIVE

## 2015-09-18 NOTE — Progress Notes (Signed)
Patient ID: Brandon Ho, male   DOB: 1945/11/26, 70 y.o.   MRN: [id number]    By signing my name below, I, Essence Howell, attest that this documentation has been prepared under the direction and in the presence of Collene GobbleSteven A Aedyn Kempfer, MD Electronically Signed: Charline BillsEssence Howell, ED Scribe 09/18/2015 at 11:15 AM.  Chief Complaint:  Chief Complaint  Patient presents with  . Hospitalization Follow-up    SODIUM LEVEL DROPED  . Other    BOWEL MOVEMENT  . Other    NUMBNESS IN THE BACK OF HEAD    HPI: Brandon Ho is a 70 y.o. male [facility] follow[facility]up for hyponatremia. Pt states that nausea, urinary symptoms and loss of appetite have improved. He has been complaint with Flomax. No h/o tobacco use.  Numbness in Occipital Head Pt presents with intermittent numbness in the occipital head which resolves with lying.   Constipation Pt has been taking Miralax daily for 1 week for constipation but has had soft stools daily this week. He has not had a colonoscopy recently but he has had an air contrast barium enema several years ago.   Dental Abscess  Pt has finished antibiotics for his dental abscess 2 days ago. He is not scheduled to follow-up with a dentist unless he has new dental complaints.   Past Medical History:  Diagnosis Date  . Irregular heart beat 1975  . Tooth abscess    Past Surgical History:  Procedure Laterality Date  . CATARACT EXTRACTION W/ INTRAOCULAR LENS IMPLANT Bilateral    Social History   Social History  . Marital status: Single    Spouse name: N/A  . Number of children: N/A  . Years of education: N/A   Social History Main Topics  . Smoking status: Never Smoker  . Smokeless tobacco: Never Used  . Alcohol use No  . Drug use: No  . Sexual activity: Not Asked   Other Topics Concern  . None   Social History Narrative  . None   Family History  Problem Relation Age of Onset  . Heart disease Mother   . Hypertension  Mother   . Stroke Mother    Allergies  Allergen Reactions  . Chlorhexidine Other (See Comments)    Mouth tingling  . Mouth Rinse [Cetylpyridinium Chloride]     Facial tingling    Prior to Admission medications   Medication Sig Start Date End Date Taking? Authorizing Provider  amLODipine (NORVASC) 5 MG tablet Take 1 tablet (5 mg total) by mouth daily. 09/06/15  Yes Nishant Dhungel, MD  feeding supplement, ENSURE ENLIVE, (ENSURE ENLIVE) LIQD Take 237 mLs by mouth 2 (two) times daily between meals. 09/06/15  Yes Nishant Dhungel, MD  propranolol (INDERAL) 40 MG tablet Take 40 mg by mouth 2 (two) times daily.    Yes Historical Provider, MD  tamsulosin (FLOMAX) 0.4 MG CAPS capsule Take 1 capsule (0.4 mg total) by mouth daily after supper. 09/07/15  Yes Nishant Dhungel, MD  potassium chloride 20 MEQ TBCR Take 20 mEq by mouth daily. Patient not taking: Reported on 09/18/2015 09/06/15   Nishant Dhungel, MD   ROS: The patient denies fevers, chills, night sweats, unintentional weight loss, chest pain, palpitations, wheezing, dyspnea on exertion, nausea, vomiting, abdominal pain, dysuria, hematuria, melena, weakness, or tingling. +numbness   All other systems have been reviewed and were otherwise negative with the exception of those mentioned in the HPI and as above.    PHYSICAL EXAM:  Vitals:   09/18/15 1018  BP: 120/72  Pulse: 80  Resp: 18  Temp: 97.7 F (36.5 C)   Body mass index is 22.47 kg/m.  General: Alert, no acute distress HEENT:  Normocephalic, atraumatic, oropharynx patent. Tenderness over occipital area of scalp  [facility] Cardiovascular:  Regular rate and rhythm, no rubs murmurs or gallops. No Carotid bruits, radial pulse intact. No pedal edema.  Respiratory: Clear to auscultation bilaterally. No wheezes, rales, or rhonchi. No cyanosis, no use of accessory musculature Abdominal: No organomegaly, abdomen is soft and non-tender, positive bowel sounds. No masses. 1 cm umbilical  hernia  Musculoskeletal: Gait intact. No edema, tenderness GU: prostate is normal size. No masses.  Skin: No rashes. Neurologic: Facial musculature symmetric. Psychiatric: Patient acts appropriately throughout our interaction. Lymphatic: No cervical or submandibular lymphadenopathy  LABS: Results for orders placed or performed in visit on 09/18/15  POCT CBC  Result Value Ref Range   WBC 6.7 4.6 - 10.2 K/uL   Lymph, poc 1.8 0.6 - 3.4   POC LYMPH PERCENT 26.6 10 - 50 %L   MID (cbc) 0.4 0 - 0.9   POC MID % 6.7 0 - 12 %M   POC Granulocyte 4.5 2 - 6.9   Granulocyte percent 66.7 37 - 80 %G   RBC 4.05 (A) 4.69 - 6.13 M/uL   Hemoglobin 12.6 (A) 14.1 - 18.1 g/dL   HCT, POC 16.135.0 (A) 09.643.5 - 53.7 %   MCV 86.5 80 - 97 fL   MCH, POC 31.1 27 - 31.2 pg   MCHC 35.9 (A) 31.8 - 35.4 g/dL   RDW, POC 04.514.0 %   Platelet Count, POC 214 142 - 424 K/uL   MPV 6.4 0 - 99.8 fL    EKG/XRAY:   Primary read interpreted [facility].  ASSESSMENT/PLAN: Patient's sodium and potassium  level repeated today. He has symptoms of BPH and on Flomax. He declines a flu shot. He declines pneumonia vaccine. He is not interested in a colonoscopy. Blood pressure is well controlled on Norvasc 5. He persists in having pain in the back of his head. I suspect this may be related to his cervical spine. Will check an MRI of the cervical spine.I personally performed the services described in this documentation, which was scribed in my presence. The recorded information has been reviewed and is accurate.Recheck 3 weeks   Gross sideeffects, risk and benefits, and alternatives of medications d/w patient. Patient is aware that all medications have potential sideeffects and we are unable to predict every sideeffect or drug-drug interaction that may occur.  Lesle ChrisSteven Diasha Castleman MD 09/18/2015 10:24 AM

## 2015-09-18 NOTE — Patient Instructions (Signed)
     IF you received an x-ray today, you will receive an invoice from [facility]. Please contact [facility] at [phone] with questions or concerns regarding your invoice.   IF you received labwork today, you will receive an invoice from [facility]. Please contact [facility] at [phone] with questions or concerns regarding your invoice.   Our billing staff will not be able to assist you with questions regarding bills from these companies.  You will be contacted with the lab results as soon as they are available. The fastest way to get your results is to activate your My Chart account. Instructions are located on the last page of this paperwork. If you have not heard from us regarding the results in 2 weeks, please contact this office.      

## 2015-09-20 ENCOUNTER — Telehealth: Payer: Self-pay | Admitting: Emergency Medicine

## 2015-09-20 NOTE — Telephone Encounter (Signed)
I received a call home last night the patient is concerned his landlord will force him to move. He feels he is physically not able at the present time to move. I told him I would write him a letter in his behalf that he is currently undergoing testing regarding resistant symptoms. I will also note that he has been seriously ill and is not physically able to move at the present time.

## 2015-09-21 ENCOUNTER — Telehealth: Payer: Self-pay

## 2015-09-21 NOTE — Telephone Encounter (Signed)
Patient needs the letter that Daub was suppose Cleta Albertsto write, there is a message in to Percyamara but I think since she has been on the floor she hasnt gotten to it yet. Can someone else look at his chart and write the letter. Thank you  Please call him when it is ready [phone]

## 2015-09-22 NOTE — Telephone Encounter (Signed)
Awaiting signature, letter written.

## 2015-09-22 NOTE — Telephone Encounter (Signed)
Letter written, awaiting signature

## 2015-09-23 ENCOUNTER — Encounter: Payer: Self-pay | Admitting: *Deleted

## 2015-10-06 ENCOUNTER — Ambulatory Visit (INDEPENDENT_AMBULATORY_CARE_PROVIDER_SITE_OTHER): Payer: Self-pay | Admitting: Emergency Medicine

## 2015-10-06 ENCOUNTER — Encounter: Payer: Self-pay | Admitting: Emergency Medicine

## 2015-10-06 VITALS — BP 132/80 | HR 71 | Temp 97.5°F | Resp 16 | Ht 65.0 in | Wt 135.8 lb

## 2015-10-06 DIAGNOSIS — E871 Hypo-osmolality and hyponatremia: Secondary | ICD-10-CM

## 2015-10-06 DIAGNOSIS — I10 Essential (primary) hypertension: Secondary | ICD-10-CM

## 2015-10-06 DIAGNOSIS — N400 Enlarged prostate without lower urinary tract symptoms: Secondary | ICD-10-CM

## 2015-10-06 DIAGNOSIS — R519 Headache, unspecified: Secondary | ICD-10-CM

## 2015-10-06 DIAGNOSIS — R51 Headache: Secondary | ICD-10-CM

## 2015-10-06 DIAGNOSIS — Z1211 Encounter for screening for malignant neoplasm of colon: Secondary | ICD-10-CM

## 2015-10-06 LAB — BASIC METABOLIC PANEL WITH GFR
BUN: 17 mg/dL (ref 7–25)
CALCIUM: 8.8 mg/dL (ref 8.6–10.3)
CO2: 26 mmol/L (ref 20–31)
CREATININE: 1.15 mg/dL (ref 0.70–1.25)
Chloride: 103 mmol/L (ref 98–110)
GFR, EST NON AFRICAN AMERICAN: 65 mL/min (ref >=60)
GFR, Est African American: 75 mL/min (ref >=60)
Glucose, Bld: 84 mg/dL (ref 65–99)
Potassium: 4.1 mmol/L (ref 3.5–5.3)
SODIUM: 137 mmol/L (ref 135–146)

## 2015-10-06 LAB — POCT CBC
GRANULOCYTE PERCENT: 65.3 % (ref 37–80)
HEMATOCRIT: 35.7 % — AB (ref 43.5–53.7)
HEMOGLOBIN: 12.8 g/dL — AB (ref 14.1–18.1)
LYMPH, POC: 1.7 (ref 0.6–3.4)
MCH, POC: 31.6 pg — AB (ref 27–31.2)
MCHC: 35.7 g/dL — AB (ref 31.8–35.4)
MCV: 88.6 fL (ref 80–97)
MID (cbc): 0.7 (ref 0–0.9)
MPV: 6.3 fL (ref 0–99.8)
PLATELET COUNT, POC: 173 10*3/uL (ref 142–424)
POC GRANULOCYTE: 4.4 (ref 2–6.9)
POC LYMPH PERCENT: 24.8 %L (ref 10–50)
POC MID %: 9.9 %M (ref 0–12)
RBC: 4.04 M/uL — AB (ref 4.69–6.13)
RDW, POC: 14.4 %
WBC: 6.8 10*3/uL (ref 4.6–10.2)

## 2015-10-06 MED ORDER — TAMSULOSIN HCL 0.4 MG PO CAPS: 0.4000 mg | ORAL_CAPSULE | Freq: Every day | ORAL | 11 refills | Status: AC

## 2015-10-06 NOTE — Progress Notes (Signed)
By signing my name below, I, Brandon Ho, attest that this documentation has been prepared under the direction and in the presence of Brandon Ho.  Electronically Signed: Arvilla Ho, Medical Scribe. 10/06/15. 8:59 AM.  Chief Complaint:  Chief Complaint  Patient presents with  . Other    check sodium level, follow up hospital visit x 1 month ago, Brandon Ho, ( pt declined flu shot)    HPI: Brandon Ho is a 70 y.o. male who reports to [facility] today for a 1 month hospital follow-up. Pt was hospitalized 09/04/15 for hyponatremia thought to be in secondary to combination of dehydration and diuretics.  Pt states he may have to move and has from now to Nov 1st to get his bathroom fixed.  HA: Pt states he's not back to nl but his HAs have been gradually improving. Pt mentions he can eat now with out vomiting. Pt was exposed to second hand smoke from the people he was living with since they "smoked like a chimney". Pt reports having a CXR. Pt denies vomiting.  HTN: Pt states his bp get higher when he goes to the dentist. Pt was told norvasc is causing his hyponatremia. Pt is compliant with Inderal and Flomax  Lab Results  Component Value Date   PSA 1.6 09/18/2015   BM: Pt reports having soft stools. Pt is willing to talk to GI for a colonoscopy.  Wt Readings from Last 3 Encounters:  10/06/15 135 lb 12.8 oz (61.6 kg)  09/18/15 135 lb (61.2 kg)  09/05/15 140 lb (63.5 kg)   Past Medical History:  Diagnosis Date  . Irregular heart beat 1975  . Tooth abscess    Past Surgical History:  Procedure Laterality Date  . CATARACT EXTRACTION W/ INTRAOCULAR LENS IMPLANT Bilateral    Social History   Social History  . Marital status: Single    Spouse name: N/A  . Number of children: N/A  . Years of education: N/A   Social History Main Topics  . Smoking status: Never Smoker  . Smokeless tobacco: Never Used  . Alcohol use No  . Drug use: No  . Sexual activity: Not on file    Other Topics Concern  . Not on file   Social History Narrative  . No narrative on file   Family History  Problem Relation Age of Onset  . Heart disease Mother   . Hypertension Mother   . Stroke Mother    Allergies  Allergen Reactions  . Chlorhexidine Other (See Comments)    Mouth tingling  . Mouth Rinse [Cetylpyridinium Chloride]     Facial tingling    Prior to Admission medications   Medication Sig Start Date End Date Taking? Authorizing Provider  amLODipine (NORVASC) 5 MG tablet Take 1 tablet (5 mg total) by mouth daily. 09/06/15  Yes Nishant Dhungel, MD  feeding supplement, ENSURE ENLIVE, (ENSURE ENLIVE) LIQD Take 237 mLs by mouth 2 (two) times daily between meals. 09/06/15  Yes Nishant Dhungel, MD  propranolol (INDERAL) 40 MG tablet Take 40 mg by mouth 2 (two) times daily.    Yes Historical Provider, MD  tamsulosin (FLOMAX) 0.4 MG CAPS capsule Take 1 capsule (0.4 mg total) by mouth daily after supper. 09/07/15  Yes Nishant Dhungel, MD  potassium chloride 20 MEQ TBCR Take 20 mEq by mouth daily. Patient not taking: Reported on 09/18/2015 09/06/15   Nishant Dhungel, MD     ROS: The patient denies fevers, chills, night sweats, unintentional weight loss,  chest pain, palpitations, wheezing, dyspnea on exertion, nausea, vomiting, abdominal pain, dysuria, hematuria, melena, numbness, weakness, or tingling.  All other systems have been reviewed and were otherwise negative with the exception of those mentioned in the HPI and as above.    PHYSICAL EXAM: Vitals:   10/06/15 0836  BP: 132/80  Pulse: 71  Resp: 16  Temp: 97.5 F (36.4 C)   Body mass index is 22.6 kg/m.   General: Alert, no acute distress. significant poor dentation  HEENT:  Normocephalic, atraumatic, oropharynx patent. Neck: limited ROM to right and left Eye: [facility] Cardiovascular:  Regular rate and rhythm, no rubs murmurs or gallops.  No Carotid bruits, radial pulse intact. No pedal edema.  Respiratory:  Clear to auscultation bilaterally.  No wheezes, rales, or rhonchi.  No cyanosis, no use of accessory musculature Abdominal: No organomegaly, abdomen is soft with 1 cm umbilical hernia, positive bowel sounds.  No masses. Musculoskeletal: Gait intact. No edema, tenderness. No upper extremity weakness Skin: No rashes. Neurologic: Facial musculature symmetric. Psychiatric: Patient acts appropriately throughout our interaction. Lymphatic: No cervical or submandibular lymphadenopathy  LABS: Results for orders placed or performed in visit on 10/06/15  POCT CBC  Result Value Ref Range   WBC 6.8 4.6 - 10.2 K/uL   Lymph, poc 1.7 0.6 - 3.4   POC LYMPH PERCENT 24.8 10 - 50 %L   MID (cbc) 0.7 0 - 0.9   POC MID % 9.9 0 - 12 %M   POC Granulocyte 4.4 2 - 6.9   Granulocyte percent 65.3 37 - 80 %G   RBC 4.04 (A) 4.69 - 6.13 M/uL   Hemoglobin 12.8 (A) 14.1 - 18.1 g/dL   HCT, POC 14.735.7 (A) 82.943.5 - 53.7 %   MCV 88.6 80 - 97 fL   MCH, POC 31.6 (A) 27 - 31.2 pg   MCHC 35.7 (A) 31.8 - 35.4 g/dL   RDW, POC 56.214.4 %   Platelet Count, POC 173 142 - 424 K/uL   MPV 6.3 0 - 99.8 fL   EKG/XRAY:   Primary read interpreted [facility]   ASSESSMENT/PLAN: Patient is doing well. He did not have his scan of his neck because he says his headaches are getting better. I did make referral to GI for colonoscopy. Will have him follow up.  Dr. Neva SeatGreene.I personally performed the services described in this documentation, which was scribed in my presence. The recorded information has been reviewed and is accurate.   Gross sideeffects, risk and benefits, and alternatives of medications d/w patient. Patient is aware that all medications have potential sideeffects and we are unable to predict every sideeffect or drug-drug interaction that may occur.  Brandon ChrisSteven Karinne Schmader MD 10/06/2015 8:59 AM

## 2015-10-06 NOTE — Patient Instructions (Signed)
     IF you received an x-ray today, you will receive an invoice from [facility]. Please contact [facility] at [phone] with questions or concerns regarding your invoice.   IF you received labwork today, you will receive an invoice from [facility]. Please contact [facility] at [phone] with questions or concerns regarding your invoice.   Our billing staff will not be able to assist you with questions regarding bills from these companies.  You will be contacted with the lab results as soon as they are available. The fastest way to get your results is to activate your My Chart account. Instructions are located on the last page of this paperwork. If you have not heard from us regarding the results in 2 weeks, please contact this office.      

## 2015-12-05 ENCOUNTER — Encounter (HOSPITAL_COMMUNITY): Payer: Self-pay | Admitting: Emergency Medicine

## 2015-12-05 ENCOUNTER — Emergency Department (HOSPITAL_COMMUNITY): Payer: Medicare Other

## 2015-12-05 ENCOUNTER — Inpatient Hospital Stay (HOSPITAL_COMMUNITY)
Admission: EM | Admit: 2015-12-05 | Discharge: 2015-12-10 | DRG: 309 | Disposition: A | Discharge status: Home / Self Care | Payer: Medicare Other | Attending: Cardiovascular Disease | Admitting: Cardiovascular Disease

## 2015-12-05 ENCOUNTER — Telehealth: Payer: Self-pay | Admitting: Family Medicine

## 2015-12-05 ENCOUNTER — Ambulatory Visit: Payer: Medicare Other

## 2015-12-05 ENCOUNTER — Ambulatory Visit (INDEPENDENT_AMBULATORY_CARE_PROVIDER_SITE_OTHER): Payer: Medicare Other | Admitting: Family Medicine

## 2015-12-05 VITALS — BP 130/78 | HR 96 | Temp 97.6°F | Resp 17 | Ht 65.0 in | Wt 138.0 lb

## 2015-12-05 DIAGNOSIS — I248 Other forms of acute ischemic heart disease: Secondary | ICD-10-CM | POA: Diagnosis present

## 2015-12-05 DIAGNOSIS — R079 Chest pain, unspecified: Secondary | ICD-10-CM | POA: Diagnosis not present

## 2015-12-05 DIAGNOSIS — R197 Diarrhea, unspecified: Secondary | ICD-10-CM

## 2015-12-05 DIAGNOSIS — R9431 Abnormal electrocardiogram [ECG] [EKG]: Secondary | ICD-10-CM

## 2015-12-05 DIAGNOSIS — R778 Other specified abnormalities of plasma proteins: Secondary | ICD-10-CM

## 2015-12-05 DIAGNOSIS — R7989 Other specified abnormal findings of blood chemistry: Secondary | ICD-10-CM

## 2015-12-05 DIAGNOSIS — Z23 Encounter for immunization: Secondary | ICD-10-CM

## 2015-12-05 DIAGNOSIS — I4891 Unspecified atrial fibrillation: Secondary | ICD-10-CM

## 2015-12-05 DIAGNOSIS — Z79899 Other long term (current) drug therapy: Secondary | ICD-10-CM

## 2015-12-05 DIAGNOSIS — A0472 Enterocolitis due to Clostridium difficile, not specified as recurrent: Secondary | ICD-10-CM | POA: Diagnosis present

## 2015-12-05 DIAGNOSIS — I480 Paroxysmal atrial fibrillation: Secondary | ICD-10-CM | POA: Diagnosis not present

## 2015-12-05 DIAGNOSIS — Z7982 Long term (current) use of aspirin: Secondary | ICD-10-CM

## 2015-12-05 DIAGNOSIS — R002 Palpitations: Secondary | ICD-10-CM

## 2015-12-05 DIAGNOSIS — I499 Cardiac arrhythmia, unspecified: Secondary | ICD-10-CM

## 2015-12-05 DIAGNOSIS — R000 Tachycardia, unspecified: Secondary | ICD-10-CM

## 2015-12-05 DIAGNOSIS — E876 Hypokalemia: Secondary | ICD-10-CM | POA: Diagnosis present

## 2015-12-05 LAB — BASIC METABOLIC PANEL
Anion gap: 8 (ref 5–15)
BUN: 13 mg/dL (ref 6–20)
CHLORIDE: 105 mmol/L (ref 101–111)
CO2: 23 mmol/L (ref 22–32)
CREATININE: 1.19 mg/dL (ref 0.61–1.24)
Calcium: 8.4 mg/dL — ABNORMAL LOW (ref 8.9–10.3)
GFR calc non Af Amer: >60 mL/min (ref >60)
GLUCOSE: 107 mg/dL — AB (ref 65–99)
Potassium: 3.3 mmol/L — ABNORMAL LOW (ref 3.5–5.1)
Sodium: 136 mmol/L (ref 135–145)

## 2015-12-05 LAB — TSH: TSH: 6.164 u[IU]/mL — AB (ref 0.350–4.500)

## 2015-12-05 LAB — PROTIME-INR
INR: 1.15
Prothrombin Time: 14.7 seconds (ref 11.4–15.2)

## 2015-12-05 LAB — CBC
HEMATOCRIT: 35 % — AB (ref 39.0–52.0)
Hemoglobin: 11.8 g/dL — ABNORMAL LOW (ref 13.0–17.0)
MCH: 29.9 pg (ref 26.0–34.0)
MCHC: 33.7 g/dL (ref 30.0–36.0)
MCV: 88.6 fL (ref 78.0–100.0)
PLATELETS: 166 10*3/uL (ref 150–400)
RBC: 3.95 MIL/uL — ABNORMAL LOW (ref 4.22–5.81)
RDW: 13.2 % (ref 11.5–15.5)
WBC: 10 10*3/uL (ref 4.0–10.5)

## 2015-12-05 LAB — MAGNESIUM: Magnesium: 1.9 mg/dL (ref 1.7–2.4)

## 2015-12-05 LAB — TROPONIN I: Troponin I: 0.54 ng/mL (ref <0.03)

## 2015-12-05 LAB — BRAIN NATRIURETIC PEPTIDE: B NATRIURETIC PEPTIDE 5: 66 pg/mL (ref 0.0–100.0)

## 2015-12-05 MED ORDER — SODIUM CHLORIDE 0.9 % IV BOLUS (SEPSIS)
1000.0000 mL | Freq: Once | INTRAVENOUS | Status: AC
  Administered 2015-12-05: 1000 mL via INTRAVENOUS

## 2015-12-05 MED ORDER — HEPARIN (PORCINE) IN NACL 100-0.45 UNIT/ML-% IJ SOLN
1000.0000 [IU]/h | INTRAMUSCULAR | Status: AC
  Administered 2015-12-05: 850 [IU]/h via INTRAVENOUS
  Administered 2015-12-06: 900 [IU]/h via INTRAVENOUS
  Filled 2015-12-05 (×2): qty 250

## 2015-12-05 MED ORDER — ASPIRIN 81 MG PO CHEW
324.0000 mg | CHEWABLE_TABLET | Freq: Once | ORAL | Status: DC
  Filled 2015-12-05: qty 4

## 2015-12-05 MED ORDER — HEPARIN BOLUS VIA INFUSION
3000.0000 [IU] | Freq: Once | INTRAVENOUS | Status: AC
  Administered 2015-12-05: 3000 [IU] via INTRAVENOUS
  Filled 2015-12-05: qty 3000

## 2015-12-05 MED ORDER — ACETAMINOPHEN 325 MG PO TABS: 650.0000 mg | ORAL_TABLET | Freq: Four times a day (QID) | ORAL | Status: DC | PRN

## 2015-12-05 MED ORDER — ASPIRIN EC 81 MG PO TBEC: 81.0000 mg | DELAYED_RELEASE_TABLET | Freq: Once | ORAL | 0 refills | Status: DC

## 2015-12-05 MED ORDER — ASPIRIN 325 MG PO TABS
325.0000 mg | ORAL_TABLET | Freq: Once | ORAL | Status: AC
  Administered 2015-12-05: 325 mg via ORAL

## 2015-12-05 MED ORDER — POTASSIUM CHLORIDE ER 10 MEQ PO TBCR
20.0000 meq | EXTENDED_RELEASE_TABLET | Freq: Two times a day (BID) | ORAL | Status: AC
  Administered 2015-12-05 – 2015-12-07 (×4): 20 meq via ORAL
  Filled 2015-12-05 (×8): qty 2

## 2015-12-05 MED ORDER — DILTIAZEM LOAD VIA INFUSION
20.0000 mg | Freq: Once | INTRAVENOUS | Status: AC
  Administered 2015-12-05: 20 mg via INTRAVENOUS
  Filled 2015-12-05: qty 20

## 2015-12-05 MED ORDER — DILTIAZEM HCL 100 MG IV SOLR
5.0000 mg/h | INTRAVENOUS | Status: DC
  Administered 2015-12-05: 5 mg/h via INTRAVENOUS
  Administered 2015-12-06 (×2): 10 mg/h via INTRAVENOUS
  Filled 2015-12-05 (×4): qty 100

## 2015-12-05 MED ORDER — PROPRANOLOL HCL 40 MG PO TABS
40.0000 mg | ORAL_TABLET | Freq: Two times a day (BID) | ORAL | Status: DC
Start: 2015-12-05 — End: 2015-12-10
  Administered 2015-12-05 – 2015-12-10 (×10): 40 mg via ORAL
  Filled 2015-12-05 (×10): qty 1

## 2015-12-05 MED ORDER — TAMSULOSIN HCL 0.4 MG PO CAPS
0.4000 mg | ORAL_CAPSULE | Freq: Every day | ORAL | Status: DC
  Administered 2015-12-05 – 2015-12-09 (×5): 0.4 mg via ORAL
  Filled 2015-12-05 (×5): qty 1

## 2015-12-05 MED ORDER — ACETAMINOPHEN 650 MG RE SUPP: 650.0000 mg | Freq: Four times a day (QID) | RECTAL | Status: DC | PRN

## 2015-12-05 NOTE — ED Notes (Signed)
ED Provider at bedside. 

## 2015-12-05 NOTE — ED Triage Notes (Signed)
Pt went to pcp for diarrhea x 3 days and [facility] that has been ix2 days and AFIB RvR rate 140s'180's.

## 2015-12-05 NOTE — Progress Notes (Signed)
Patient arrived the unit from the ED on a hospital bed, assessment completed see flowsheet , patient oriented to room and staff, placed on tele , ccmd notified, bed in lowest position , call light within reach will continue to monitor 

## 2015-12-05 NOTE — Progress Notes (Signed)
By signing my name below, I, [name], attest that this documentation has been prepared under the direction and in the presence of Meredith Staggers, MD.  Electronically Signed: Arvilla Market, Medical Scribe. 12/05/15. 12:28 PM.  Subjective:    Patient ID: Brandon Ho, male    DOB: 1945/11/08, 70 y.o.   MRN: [id number]  HPI Chief Complaint  Patient presents with  . Diarrhea    Since Saturday  . Shoulder Pain    Bilateral    HPI Comments: JAICEON Ho is a 70 y.o. male who presents to the [facility] complaining of multiple medical problems including diarrhea for the past 2 days and bilateral shoulder pain.  Diarrhea: Admitted to ED Aug 11th and had sodium 129. He had follow-up sodium of 137 Sept 8th with office visit with Dr. Cleta Alberts when he was seen for lose stool. Was referred to GI at that time but pt didn't go since he was ill.  Started 3 days ago and worsening yesterday. Pt went to the commode 6x yesterday, 3x last night, and 3x today. Reports mild nausea today, and midsection abdominal pain. He has been drinking plenty of water and takes insure QD since he looked under-nourished when he went to the hopsital 09/08/15. Denies sick contacts, difficulty urinating, fevers, and emesis.  Reports he had a shot in his hip when he went to the ED 09/08/15 for blood clot prevention.  Wt Readings from Last 3 Encounters:  12/05/15 138 lb (62.6 kg)  10/06/15 135 lb 12.8 oz (61.6 kg)  09/18/15 135 lb (61.2 kg)   Intermittent Chest Pain: For the past 3 days. Chest pain last for 20 mins and it occurs while he's laying down or exercising. Sometimes he has bilateral arm pain with his chest pain, but not every time. Had palpitations last night and this morning and mentions occasional cough. Mentions he's always had palpitations and takes Inderal for his symptoms. Denies PMHx of MI and CVA. Denies weakness, blurry vision, HA, chest pain with palpitations, fever, SOB, nausea,  and diaphoresis.  Intermittent Upper Back/Shoulder Pain: Onset the past 3 days and currently rates it 4/10. Denies recent or previous injury.  Patient Active Problem List   Diagnosis Date Noted  . BPH (benign prostatic hyperplasia) 09/07/2015  . Protein-calorie malnutrition, severe 09/06/2015  . Hypokalemia 09/06/2015  . Dehydration with hyponatremia 09/06/2015  . Tooth abscess 09/06/2015  . Generalized weakness 09/05/2015  . Hyponatremia 09/05/2015  . Headache 09/05/2015  . Abnormal CT scan of head 09/02/2015  . Essential hypertension 08/31/2015   Past Medical History:  Diagnosis Date  . Irregular heart beat 1975  . Tooth abscess    Past Surgical History:  Procedure Laterality Date  . CATARACT EXTRACTION W/ INTRAOCULAR LENS IMPLANT Bilateral    Allergies  Allergen Reactions  . Chlorhexidine Other (See Comments)    Mouth tingling  . Mouth Rinse [Cetylpyridinium Chloride]     Facial tingling    Prior to Admission medications   Medication Sig Start Date End Date Taking? Authorizing Provider  amLODipine (NORVASC) 5 MG tablet Take 1 tablet (5 mg total) by mouth daily. 09/06/15  Yes Nishant Dhungel, MD  feeding supplement, ENSURE ENLIVE, (ENSURE ENLIVE) LIQD Take 237 mLs by mouth 2 (two) times daily between meals. 09/06/15  Yes Nishant Dhungel, MD  propranolol (INDERAL) 40 MG tablet Take 40 mg by mouth 2 (two) times daily.    Yes Historical Provider, MD  tamsulosin (FLOMAX) 0.4 MG CAPS capsule  Take 1 capsule (0.4 mg total) by mouth daily after supper. 10/06/15  Yes Collene GobbleSteven A Daub, MD  potassium chloride 20 MEQ TBCR Take 20 mEq by mouth daily. Patient not taking: Reported on 12/05/2015 09/06/15   Eddie NorthNishant Dhungel, MD   Social History   Social History  . Marital status: Single    Spouse name: N/A  . Number of children: N/A  . Years of education: N/A   Occupational History  . Not on file.   Social History Main Topics  . Smoking status: Never Smoker  . Smokeless tobacco: Never Used   . Alcohol use No  . Drug use: No  . Sexual activity: Not on file   Other Topics Concern  . Not on file   Social History Narrative  . No narrative on file   Review of Systems  Constitutional: Negative for fever.  Eyes: Negative for visual disturbance.  Respiratory: Positive for cough. Negative for shortness of breath.   Cardiovascular: Positive for chest pain and palpitations.  Gastrointestinal: Positive for abdominal pain, diarrhea and nausea. Negative for vomiting.  Genitourinary: Negative for difficulty urinating.  Musculoskeletal: Positive for myalgias (arm pain).  Neurological: Negative for weakness and headaches.   Objective:  Physical Exam  Constitutional: He appears well-developed and well-nourished. No distress.  HENT:  Head: Normocephalic and atraumatic.  Eyes: Conjunctivae are normal.  Neck: Neck supple.  Cardiovascular: Normal rate.  An irregularly irregular rhythm present. Exam reveals no friction rub.   No murmur heard. Pulmonary/Chest: Effort normal and breath sounds normal. No respiratory distress. He has no wheezes. He has no rales.  somewhat reproducible pain across the upper chest wall  Abdominal: Soft. He exhibits no distension.  Neurological: He is alert.  Skin: Skin is warm and dry.  Psychiatric: He has a normal mood and affect. His behavior is normal.  Nursing note and vitals reviewed.  BP 118/82 (BP Location: Right Arm, Patient Position: Sitting, Cuff Size: Normal)   Pulse 96   Temp 97.6 F (36.4 C) (Oral)   Resp 17   Ht 5\' 5"  (1.651 m)   Wt 138 lb (62.6 kg)   SpO2 99%   BMI 22.96 kg/m    Previous EKG which was Aug 7th was sinus rhythm with non-specific t-wave abnormality. QTC of 490  EKG Reading: A. Fib. Rate 150. ST depression, V3-V5 possible subendocardial injury/ischemia.  [1:00 PM] Given ASA 324 mg chewable. Placed on heart monitor. EMS was called for emergent transport. Pain was rated 4/10 in his back with pressure sensations. He  appears diaphoretic. Denies any chest pain. BP 130/78   [1:11 PM] Transfer of care and report given to EMS.  Vitals:   12/05/15 1121  BP: 118/82  Pulse: 96  Resp: 17  Temp: 97.6 F (36.4 C)  TempSrc: Oral  SpO2: 99%  Weight: 138 lb (62.6 kg)  Height: 5\' 5"  (1.651 m)     Assessment & Plan:   Brandon CoffinCharles W Foulk is a 70 y.o. male Chest pain, unspecified type - Plan: POCT CBC, EKG 12-Lead, aspirin EC 81 MG tablet, CANCELED: DG Chest 2 View  Diarrhea, unspecified type - Plan: COMPLETE METABOLIC PANEL WITH GFR, Gastrointestinal Pathogen Panel PCR  Irregular heartbeat - Plan: EKG 12-Lead  Palpitations  Tachycardia - Plan: EKG 12-Lead, aspirin EC 81 MG tablet, CANCELED: DG Chest 2 View  ST segment depression - Plan: aspirin EC 81 MG tablet  Atrial fibrillation, unspecified type (HCC) - Plan: aspirin EC 81 MG tablet  Initial presentation with  diarrhea for 3 days, but also episodic bilateral upper chest, shoulder, arm pain and upper back pain. Noted to be irregular heart rhythm on exam, tachycardic, EKG obtained showing atrial fibrillation with V3 through V5 ST depression. Concern for subendocardial ischemia versus injury, but may be strain with afib. EMS called for transport as above, IV placed, aspirin 324 mg given chewable.  For diarrhea - hospitalization past few months as well as prior antibiotics, with persistent, increasing diarrhea, consider GI pathogen panel to rule out C. difficile or other infectious diarrhea. Also with history of hyponatremia, plan on CMP, but this was unable to be obtained prior to transport by EMS for above symptoms. Follow-up precautions discussed.   Meds ordered this encounter  Medications  . aspirin EC 81 MG tablet    Sig: Take 1 tablet (81 mg total) by mouth once.    Dispense:  4 tablet    Refill:  0   Patient Instructions   Although you will need further evaluation or diarrhea, unfortunately your heart rhythm and abnormal EKG are the primary  concern here today. For that reason I'm sending you to the hospital for further evaluation for possible heart attack or heart issue. If follow-up needed after hospital evaluation, return here at your earliest convenience. Please let your providers note emergency room about your diarrhea as you likely will need other testing for that but I was unable to order today.  Return to the clinic or go to the nearest emergency room if any of your symptoms worsen or new symptoms occur.    IF you received an x-ray today, you will receive an invoice from [facility]. Please contact [facility] at [phone] with questions or concerns regarding your invoice.   IF you received labwork today, you will receive an invoice from [facility]. Please contact [facility] at [phone] with questions or concerns regarding your invoice.   Our billing staff will not be able to assist you with questions regarding bills from these companies.  You will be contacted with the lab results as soon as they are available. The fastest way to get your results is to activate your My Chart account. Instructions are located on the last page of this paperwork. If you have not heard from us regarding the results in 2 weeks, please contact this office.        I personally performed the services described in this documentation, which was scribed in my presence. The recorded information has been reviewed and considered, and addended by me as needed.   Signed,   Meredith StaggersJeffrey Glorine Hanratty, MD [facility].  12/05/15 1:18 PM

## 2015-12-05 NOTE — H&P (Signed)
Referring Physician:  PARVIN STETZER is an 70 y.o. male.                       Chief Complaint: Chest pain and diarrhea.  HPI: 70 year old male has intermittent chest pain x 3 days. No fever, occasional cough. Also has diarrhea x 3 days. At urgent care he was found to be in atrial fibrillation with RVR and sent to [facility] ED. After diltiazem drip heart rate improved to 90 to 100 beats per min.   Past Medical History:  Diagnosis Date  . Irregular heart beat 1975  . Tooth abscess       Past Surgical History:  Procedure Laterality Date  . CATARACT EXTRACTION W/ INTRAOCULAR LENS IMPLANT Bilateral     Family History  Problem Relation Age of Onset  . Heart disease Mother   . Hypertension Mother   . Stroke Mother    Social History:  reports that he has never smoked. He has never used smokeless tobacco. He reports that he does not drink alcohol or use drugs.  Allergies:  Allergies  Allergen Reactions  . Chlorhexidine Other (See Comments)    Mouth tingling  . Mouth Rinse [Cetylpyridinium Chloride]     Facial tingling   . Other Other (See Comments)    Angel food cake    Medications Prior to Admission  Medication Sig Dispense Refill  . amLODipine (NORVASC) 5 MG tablet Take 1 tablet (5 mg total) by mouth daily. 30 tablet 0  . feeding supplement, ENSURE ENLIVE, (ENSURE ENLIVE) LIQD Take 237 mLs by mouth 2 (two) times daily between meals. 237 mL 12  . propranolol (INDERAL) 40 MG tablet Take 40 mg by mouth 2 (two) times daily.     . tamsulosin (FLOMAX) 0.4 MG CAPS capsule Take 1 capsule (0.4 mg total) by mouth daily after supper. 30 capsule 11  . aspirin EC 81 MG tablet Take 1 tablet (81 mg total) by mouth once. (Patient not taking: Reported on 12/05/2015) 4 tablet 0  . potassium chloride 20 MEQ TBCR Take 20 mEq by mouth daily. (Patient not taking: Reported on 12/05/2015) 10 tablet 0    Results for orders placed or performed during the hospital encounter of 12/05/15 (from the past 48  hour(s))  Protime-INR (if pt is taking coumadin)     Status: None   Collection Time: 12/05/15  1:49 PM  Result Value Ref Range   Prothrombin Time 14.7 11.4 - 15.2 seconds   INR 1.15   CBC     Status: Abnormal   Collection Time: 12/05/15  1:49 PM  Result Value Ref Range   WBC 10.0 4.0 - 10.5 K/uL   RBC 3.95 (L) 4.22 - 5.81 MIL/uL   Hemoglobin 11.8 (L) 13.0 - 17.0 g/dL   HCT 35.0 (L) 39.0 - 52.0 %   MCV 88.6 78.0 - 100.0 fL   MCH 29.9 26.0 - 34.0 pg   MCHC 33.7 30.0 - 36.0 g/dL   RDW 13.2 11.5 - 15.5 %   Platelets 166 150 - 400 K/uL  Troponin I     Status: Abnormal   Collection Time: 12/05/15  1:49 PM  Result Value Ref Range   Troponin I 0.54 (HH) <0.03 ng/mL    Comment: CRITICAL RESULT CALLED TO, READ BACK BY AND VERIFIED WITH: A.ASHLEY,RN 12/05/15 1457 BY BSLADE   Basic metabolic panel     Status: Abnormal   Collection Time: 12/05/15  1:49 PM  Result Value Ref Range   Sodium 136 135 - 145 mmol/L   Potassium 3.3 (L) 3.5 - 5.1 mmol/L   Chloride 105 101 - 111 mmol/L   CO2 23 22 - 32 mmol/L   Glucose, Bld 107 (H) 65 - 99 mg/dL   BUN 13 6 - 20 mg/dL   Creatinine, Ser 1.19 0.61 - 1.24 mg/dL   Calcium 8.4 (L) 8.9 - 10.3 mg/dL   GFR calc non Af Amer >60 >60 mL/min   GFR calc Af Amer >60 >60 mL/min    Comment: (NOTE) The eGFR has been calculated using the CKD EPI equation. This calculation has not been validated in all clinical situations. eGFR's persistently <60 mL/min signify possible Chronic Kidney Disease.    Anion gap 8 5 - 15  Magnesium     Status: None   Collection Time: 12/05/15  1:49 PM  Result Value Ref Range   Magnesium 1.9 1.7 - 2.4 mg/dL  Brain natriuretic peptide (IF shortness of breath has been documented this visit)     Status: None   Collection Time: 12/05/15  1:49 PM  Result Value Ref Range   B Natriuretic Peptide 66.0 0.0 - 100.0 pg/mL  TSH     Status: Abnormal   Collection Time: 12/05/15  1:49 PM  Result Value Ref Range   TSH 6.164 (H) 0.350 - 4.500  uIU/mL    Comment: Performed by a 3rd Generation assay with a functional sensitivity of <=0.01 uIU/mL.   Dg Chest Portable 1 View  Result Date: 12/05/2015 CLINICAL DATA:  70 y/o M; chest pain radiating to shoulders and arms. EXAM: PORTABLE CHEST 1 VIEW COMPARISON:  08/31/2015 chest radiograph FINDINGS: Stable normal cardiac silhouette given projection and technique. Hyperinflated lungs compatible with COPD without interval change. Azygos lobe. No focal consolidation. No pneumothorax or effusion. Bones are unremarkable. IMPRESSION: No acute pulmonary process. Unchanged hyperinflation of lungs compatible with COPD. Electronically Signed   By: Kristine Garbe M.D.   On: 12/05/2015 16:00    Review Of Systems Constitutional: No fever, chills, some weight loss. Eyes: No vision change, wears glasses, no contact lens. Positive bilateral catararact surgery. Ears: No hearing loss, No tinnitus. Respiratory: No asthma, COPD, pneumonias or shortness of breath. No hemoptysis. Positive cough. Cardiovascular: Occasional chest pain, palpitation. No leg edema. Gastrointestinal: Occasional nausea, vomiting or diarrhea. No constipation. No GI bleed. No hepatitis. Genitourinary: No dysuria, hematuria or kidney stone. No incontinance. Neurological: No headache, stroke or seizures.  Psychiatry: No psych facility admission for anxiety, depression or suicide. No detox. Skin: No rash. Musculoskeletal: No joint pain or fibromyalgia. No neck pain or back pain. Lymphadenopathy: No lymphadenopathy Hematology: Mild anemia. No easy bruising.  Blood pressure 93/78, pulse 98, temperature 98.3 F (36.8 C), temperature source Oral, resp. rate 18, height _0  (1.651 m), weight 62.6 kg (138 lb), SpO2 100 %. Body mass index is 22.96 kg/m. General appearance: alert, cooperative, appears stated age and mild distress Head: Normocephalic, without obvious abnormality, atraumatic Eyes: conjunctivae/corneas clear. PERRL,  EOM's intact. Fundi benign. Neck: no adenopathy, no carotid bruit, no JVD, supple, symmetrical, trachea midline and thyroid not enlarged. Resp: clear to auscultation bilaterally. Cardio: Irregular rate and rhythm, S1, S2 normal, II/VI systolic murmur, no click, rub or gallop. Abdomen: Soft, non-tender. Neurological: A x O x 3. Moves all 4 extremities. Skin: Warm and dry.  Assessment/Plan Atrial fibrillation with RVR CHA2DS2VASc score 1 Chest pain Diarrhea  Continue diltiazem drip. IV heparin. Echocardiogram.  Birdie Riddle, MD  12/05/2015, 6:58 PM

## 2015-12-05 NOTE — Patient Instructions (Addendum)
Although you will need further evaluation or diarrhea, unfortunately your heart rhythm and abnormal EKG are the primary concern here today. For that reason I'm sending you to the hospital for further evaluation for possible heart attack or heart issue. If follow-up needed after hospital evaluation, return here at your earliest convenience. Please let your providers note emergency room about your diarrhea as you likely will need other testing for that but I was unable to order today.  Return to the clinic or go to the nearest emergency room if any of your symptoms worsen or new symptoms occur.    IF you received an x-ray today, you will receive an invoice from [facility]. Please contact [facility] at [phone] with questions or concerns regarding your invoice.   IF you received labwork today, you will receive an invoice from [facility]. Please contact [facility] at [phone] with questions or concerns regarding your invoice.   Our billing staff will not be able to assist you with questions regarding bills from these companies.  You will be contacted with the lab results as soon as they are available. The fastest way to get your results is to activate your My Chart account. Instructions are located on the last page of this paperwork. If you have not heard from us regarding the results in 2 weeks, please contact this office.

## 2015-12-05 NOTE — Addendum Note (Signed)
Addended by: Maurene CapesPOTTS, Karion Cudd M on: 12/05/2015 01:41 PM   Modules accepted: Orders

## 2015-12-05 NOTE — Progress Notes (Signed)
ANTICOAGULATION CONSULT NOTE - Initial Consult  Pharmacy Consult for heparin Indication: atrial fibrillation  Allergies  Allergen Reactions  . Chlorhexidine Other (See Comments)    Mouth tingling  . Mouth Rinse [Cetylpyridinium Chloride]     Facial tingling   . Other Other (See Comments)    Angel food cake   Patient Measurements: Height: 5\' 5"  (165.1 cm) Weight: 138 lb (62.6 kg) IBW/kg (Calculated) : 61.5 Heparin Dosing Weight: 62.6 kg  Vital Signs: Temp: 98.3 F (36.8 C) (11/07 1701) Temp Source: Oral (11/07 1701) BP: 93/78 (11/07 1701) Pulse Rate: 98 (11/07 1701)  Labs:  Recent Labs  12/05/15 1349  HGB 11.8*  HCT 35.0*  PLT 166  LABPROT 14.7  INR 1.15  CREATININE 1.19  TROPONINI 0.54*   Estimated Creatinine Clearance: 51 mL/min (by C-G formula based on SCr of 1.19 mg/dL).  Medical History: Past Medical History:  Diagnosis Date  . Irregular heart beat 1975  . Tooth abscess    Medications:  Prescriptions Prior to Admission  Medication Sig Dispense Refill Last Dose  . amLODipine (NORVASC) 5 MG tablet Take 1 tablet (5 mg total) by mouth daily. 30 tablet 0 12/02/2015 at Unknown time  . feeding supplement, ENSURE ENLIVE, (ENSURE ENLIVE) LIQD Take 237 mLs by mouth 2 (two) times daily between meals. 237 mL 12 12/04/2015 at Unknown time  . propranolol (INDERAL) 40 MG tablet Take 40 mg by mouth 2 (two) times daily.    12/05/2015 at Unknown time  . tamsulosin (FLOMAX) 0.4 MG CAPS capsule Take 1 capsule (0.4 mg total) by mouth daily after supper. 30 capsule 11 12/04/2015 at Unknown time  . aspirin EC 81 MG tablet Take 1 tablet (81 mg total) by mouth once. (Patient not taking: Reported on 12/05/2015) 4 tablet 0 Not Taking at Unknown time  . potassium chloride 20 MEQ TBCR Take 20 mEq by mouth daily. (Patient not taking: Reported on 12/05/2015) 10 tablet 0 Not Taking    Assessment:  70 y.o. male who presents to the [facility] complaining of intermittent  chest pain found to have new onset atrial fibrillation CHA2DS2VASc score 1.  No anticoagulants PTA. Pharmacy has been asked to start heparin.  Goal of Therapy:  Heparin level 0.3-0.7 units/ml Monitor platelets by anticoagulation protocol: Yes   Plan:  Give 3000 units bolus x 1 Start heparin infusion at 850 units/hr Check anti-Xa level in 8 hours and daily while on heparin Continue to monitor H&H and platelets  Lariza Cothron L Antiono Ettinger 12/05/2015,7:47 PM

## 2015-12-05 NOTE — ED Provider Notes (Addendum)
[facility]-EMERGENCY DEPT Provider Note   CSN: [id number] Arrival date & time: 12/05/15  1341     History   Chief Complaint Chief Complaint  Patient presents with  . Atrial Fibrillation  . Diarrhea    HPI Brandon CoffinCharles W Ho is a 70 y.o. male.  Pt presents to the ED today for a.fib and diarrhea.  The pt said that he has had diarrhea for 3 days.  The pt denies recent abx.  Pt has a hx of fast heart beat for which he takes inderal.  The pt did take his normal dose today.  The pt initially went to urgent care who sent pt here due to rapid a.fib on ekg.  The pt does have some cp, but no fever or cough.      Past Medical History:  Diagnosis Date  . Irregular heart beat 1975  . Tooth abscess     Patient Active Problem List   Diagnosis Date Noted  . BPH (benign prostatic hyperplasia) 09/07/2015  . Protein-calorie malnutrition, severe 09/06/2015  . Hypokalemia 09/06/2015  . Dehydration with hyponatremia 09/06/2015  . Tooth abscess 09/06/2015  . Generalized weakness 09/05/2015  . Hyponatremia 09/05/2015  . Headache 09/05/2015  . Abnormal CT scan of head 09/02/2015  . Essential hypertension 08/31/2015    Past Surgical History:  Procedure Laterality Date  . CATARACT EXTRACTION W/ INTRAOCULAR LENS IMPLANT Bilateral        Home Medications    Prior to Admission medications   Medication Sig Start Date End Date Taking? Authorizing Provider  amLODipine (NORVASC) 5 MG tablet Take 1 tablet (5 mg total) by mouth daily. 09/06/15  Yes Nishant Dhungel, MD  feeding supplement, ENSURE ENLIVE, (ENSURE ENLIVE) LIQD Take 237 mLs by mouth 2 (two) times daily between meals. 09/06/15  Yes Nishant Dhungel, MD  propranolol (INDERAL) 40 MG tablet Take 40 mg by mouth 2 (two) times daily.    Yes Historical Provider, MD  tamsulosin (FLOMAX) 0.4 MG CAPS capsule Take 1 capsule (0.4 mg total) by mouth daily after supper. 10/06/15  Yes Collene GobbleSteven A Daub, MD  aspirin EC 81 MG tablet Take 1 tablet (81 mg total) by  mouth once. Patient not taking: Reported on 12/05/2015 12/05/15 12/05/15  Shade FloodJeffrey R Greene, MD  potassium chloride 20 MEQ TBCR Take 20 mEq by mouth daily. Patient not taking: Reported on 12/05/2015 09/06/15   Eddie NorthNishant Dhungel, MD    Family History Family History  Problem Relation Age of Onset  . Heart disease Mother   . Hypertension Mother   . Stroke Mother     Social History Social History  Substance Use Topics  . Smoking status: Never Smoker  . Smokeless tobacco: Never Used  . Alcohol use No     Allergies   Chlorhexidine; Mouth rinse [cetylpyridinium chloride]; and Other   Review of Systems Review of Systems  Cardiovascular: Positive for chest pain.  Gastrointestinal: Positive for diarrhea.  All other systems reviewed and are negative.    Physical Exam Updated Vital Signs BP 123/78   Pulse 95   Resp 13   Ht 5\' 5"  (1.651 m)   Wt 138 lb (62.6 kg)   SpO2 99%   BMI 22.96 kg/m   Physical Exam  Constitutional: He is oriented to person, place, and time. He appears well-developed and well-nourished.  HENT:  Head: Normocephalic and atraumatic.  Right Ear: External ear normal.  Left Ear: External ear normal.  Nose: Nose normal.  Mouth/Throat: Oropharynx is clear and moist.  Eyes: Conjunctivae and EOM are normal. Pupils are equal, round, and reactive to light.  Neck: Normal range of motion. Neck supple.  Cardiovascular: Normal heart sounds and intact distal pulses.  An irregularly irregular rhythm present. Tachycardia present.   Pulmonary/Chest: Effort normal and breath sounds normal.  Abdominal: Soft. Bowel sounds are normal.  Musculoskeletal: Normal range of motion.  Neurological: He is alert and oriented to person, place, and time.  Skin: Skin is warm.  Psychiatric: He has a normal mood and affect. His behavior is normal. Judgment and thought content normal.  Nursing note and vitals reviewed.    ED Treatments / Results  Labs (all labs ordered are listed, but  only abnormal results are displayed) Labs Reviewed  CBC - Abnormal; Notable for the following:       Result Value   RBC 3.95 (*)    Hemoglobin 11.8 (*)    HCT 35.0 (*)    All other components within normal limits  TROPONIN I - Abnormal; Notable for the following:    Troponin I 0.54 (*)    All other components within normal limits  BASIC METABOLIC PANEL - Abnormal; Notable for the following:    Potassium 3.3 (*)    Glucose, Bld 107 (*)    Calcium 8.4 (*)    All other components within normal limits  TSH - Abnormal; Notable for the following:    TSH 6.164 (*)    All other components within normal limits  PROTIME-INR  MAGNESIUM  BRAIN NATRIURETIC PEPTIDE    EKG  EKG Interpretation  Date/Time:  Tuesday December 05 2015 14:43:41 EST Ventricular Rate:  94 PR Interval:    QRS Duration: 76 QT Interval:  363 QTC Calculation: 454 R Axis:   70 Text Interpretation:  Atrial fibrillation Ventricular premature complex Confirmed by Duha Abair MD, Kacey Dysert ([id number]) on 12/05/2015 2:59:16 PM       Radiology No results found.  Procedures .Critical Care Performed by: Jacalyn Lefevre Authorized by: Jacalyn Lefevre   Critical care provider statement:    Critical care time (minutes):  30   Critical care was necessary to treat or prevent imminent or life-threatening deterioration of the following conditions:  Circulatory failure   Critical care was time spent personally by me on the following activities:  Development of treatment plan with patient or surrogate, discussions with consultants, evaluation of patient's response to treatment, examination of patient, ordering and performing treatments and interventions, ordering and review of laboratory studies, ordering and review of radiographic studies, pulse oximetry, re-evaluation of patient's condition and review of old charts    (including critical care time)  Medications Ordered in ED Medications  diltiazem (CARDIZEM) 1 mg/mL load via  infusion 20 mg (20 mg Intravenous Bolus from Bag 12/05/15 1359)    And  diltiazem (CARDIZEM) 100 mg in dextrose 5 % 100 mL (1 mg/mL) infusion (7.5 mg/hr Intravenous Rate/Dose Change 12/05/15 1438)  aspirin chewable tablet 324 mg (324 mg Oral Not Given 12/05/15 1508)  sodium chloride 0.9 % bolus 1,000 mL (1,000 mLs Intravenous New Bag/Given 12/05/15 1358)     Initial Impression / Assessment and Plan / ED Course  I have reviewed the triage vital signs and the nursing notes.  Pertinent labs & imaging results that were available during my care of the patient were reviewed by me and considered in my medical decision making (see chart for details).  Clinical Course    Pt given 20 mg of cardizem which brought pt's heart rate down to  the 100s.  The pt's cardizem was titrated for hr.  The pt's cp is gone with hr control.  The pt has not seen a cardiologist in over 40 years.  Pt d/w Dr. Algie CofferKadakia who will admit pt.  CHADVASC score 2 (age, htn).  Dr. Algie CofferKadakia will put pt on anticoagulation.  Final Clinical Impressions(s) / ED Diagnoses   Final diagnoses:  Atrial fibrillation with rapid ventricular response (HCC)  Elevated troponin    New Prescriptions New Prescriptions   No medications on file     Jacalyn LefevreJulie Markeesha Char, MD 12/05/15 1531    Jacalyn LefevreJulie Kaydon Husby, MD 12/05/15 [phone]

## 2015-12-06 ENCOUNTER — Observation Stay (HOSPITAL_COMMUNITY): Payer: Medicare Other

## 2015-12-06 DIAGNOSIS — E876 Hypokalemia: Secondary | ICD-10-CM | POA: Diagnosis present

## 2015-12-06 DIAGNOSIS — I248 Other forms of acute ischemic heart disease: Secondary | ICD-10-CM | POA: Diagnosis present

## 2015-12-06 DIAGNOSIS — R079 Chest pain, unspecified: Secondary | ICD-10-CM | POA: Diagnosis present

## 2015-12-06 DIAGNOSIS — A0472 Enterocolitis due to Clostridium difficile, not specified as recurrent: Secondary | ICD-10-CM | POA: Diagnosis present

## 2015-12-06 DIAGNOSIS — Z79899 Other long term (current) drug therapy: Secondary | ICD-10-CM | POA: Diagnosis not present

## 2015-12-06 DIAGNOSIS — Z7982 Long term (current) use of aspirin: Secondary | ICD-10-CM | POA: Diagnosis not present

## 2015-12-06 DIAGNOSIS — I480 Paroxysmal atrial fibrillation: Secondary | ICD-10-CM | POA: Diagnosis present

## 2015-12-06 LAB — PROTIME-INR
INR: 1.23
PROTHROMBIN TIME: 15.6 s — AB (ref 11.4–15.2)

## 2015-12-06 LAB — C DIFFICILE QUICK SCREEN W PCR REFLEX
C DIFFICILE (CDIFF) INTERP: DETECTED
C DIFFICILE (CDIFF) TOXIN: POSITIVE — AB
C DIFFICLE (CDIFF) ANTIGEN: POSITIVE — AB

## 2015-12-06 LAB — BASIC METABOLIC PANEL
ANION GAP: 8 (ref 5–15)
BUN: 12 mg/dL (ref 6–20)
CALCIUM: 7.8 mg/dL — AB (ref 8.9–10.3)
CO2: 20 mmol/L — AB (ref 22–32)
CREATININE: 1.13 mg/dL (ref 0.61–1.24)
Chloride: 106 mmol/L (ref 101–111)
GFR calc Af Amer: >60 mL/min (ref >60)
GLUCOSE: 105 mg/dL — AB (ref 65–99)
Potassium: 3.2 mmol/L — ABNORMAL LOW (ref 3.5–5.1)
Sodium: 134 mmol/L — ABNORMAL LOW (ref 135–145)

## 2015-12-06 LAB — CBC
HCT: 29.9 % — ABNORMAL LOW (ref 39.0–52.0)
HEMOGLOBIN: 10.2 g/dL — AB (ref 13.0–17.0)
MCH: 30.2 pg (ref 26.0–34.0)
MCHC: 34.1 g/dL (ref 30.0–36.0)
MCV: 88.5 fL (ref 78.0–100.0)
PLATELETS: 150 10*3/uL (ref 150–400)
RBC: 3.38 MIL/uL — ABNORMAL LOW (ref 4.22–5.81)
RDW: 13.4 % (ref 11.5–15.5)
WBC: 8.8 10*3/uL (ref 4.0–10.5)

## 2015-12-06 LAB — ECHOCARDIOGRAM COMPLETE
Height: 65 in
Weight: 2190.4 oz

## 2015-12-06 LAB — HEPARIN LEVEL (UNFRACTIONATED)
Heparin Unfractionated: 0.32 IU/mL (ref 0.30–0.70)
Heparin Unfractionated: 0.38 IU/mL (ref 0.30–0.70)

## 2015-12-06 MED ORDER — VANCOMYCIN 50 MG/ML ORAL SOLUTION
125.0000 mg | Freq: Four times a day (QID) | ORAL | Status: DC
  Administered 2015-12-06 – 2015-12-10 (×15): 125 mg via ORAL
  Filled 2015-12-06 (×17): qty 2.5

## 2015-12-06 MED ORDER — DILTIAZEM HCL 60 MG PO TABS
60.0000 mg | ORAL_TABLET | Freq: Three times a day (TID) | ORAL | Status: DC
  Administered 2015-12-06 – 2015-12-10 (×12): 60 mg via ORAL
  Filled 2015-12-06 (×12): qty 1

## 2015-12-06 NOTE — Progress Notes (Signed)
ANTICOAGULATION CONSULT NOTE - Follow Up Consult  Pharmacy Consult for Heparin Indication: atrial fibrillation  Allergies  Allergen Reactions  . Chlorhexidine Other (See Comments)    Mouth tingling  . Mouth Rinse [Cetylpyridinium Chloride]     Facial tingling   . Other Other (See Comments)    Angel food cake    Patient Measurements: Height: 5\' 5"  (165.1 cm) Weight: 136 lb 14.4 oz (62.1 kg) IBW/kg (Calculated) : 61.5 Heparin Dosing Weight: 62 kg  Vital Signs: Temp: 98.4 F (36.9 C) (11/08 1255) Temp Source: Oral (11/08 1255) BP: 105/45 (11/08 1255) Pulse Rate: 60 (11/08 1255)  Labs:  Recent Labs  12/05/15 1349 12/06/15 0353 12/06/15 1201  HGB 11.8* 10.2*  --   HCT 35.0* 29.9*  --   PLT 166 150  --   LABPROT 14.7 15.6*  --   INR 1.15 1.23  --   HEPARINUNFRC  --  0.32 0.38  CREATININE 1.19 1.13  --   TROPONINI 0.54*  --   --     Estimated Creatinine Clearance: 53.7 mL/min (by C-G formula based on SCr of 1.13 mg/dL).  Assessment:  70 yr old male on IV heparin for atrial fibrillation.  Heparin level is therapeutic (0.38) on 900 units/hr.  Echo pending.  Goal of Therapy:  Heparin level 0.3-0.7 units/ml Monitor platelets by anticoagulation protocol: Yes   Plan:   Continue heparin drip at 900 units/hr.  Daily heparin level and CBC while on heparin.  Dennie Fettersgan, Lashaun Poch Donovan, [place] PageR: [phone] 12/06/2015,2:09 PM

## 2015-12-06 NOTE — Progress Notes (Signed)
Ref: Brandon Ho, STEVE A, MD   Subjective:  Diarrhea les per patient. No chest pain. Converted to sinus rhythm. Good LV systolic function on echocardiogram.  Objective:  Vital Signs in the last 24 hours: Temp:  [98 F (36.7 C)-98.4 F (36.9 C)] 98 F (36.7 C) (11/08 1946) Pulse Rate:  [60-72] 72 (11/08 1946) Cardiac Rhythm: Normal sinus rhythm (11/08 1900) Resp:  [16-18] 18 (11/08 1946) BP: (105-117)/(45-52) 111/52 (11/08 1946) SpO2:  [98 %-99 %] 98 % (11/08 1946) Weight:  [62.1 kg (136 lb 14.4 oz)] 62.1 kg (136 lb 14.4 oz) (11/08 0500)  Physical Exam: BP Readings from Last 1 Encounters:  12/06/15 (!) 111/52    Wt Readings from Last 1 Encounters:  12/06/15 62.1 kg (136 lb 14.4 oz)    Weight change:  Body mass index is 22.78 kg/m. HEENT: [name], Eyes-PERL, EOMI, Conjunctiva-Pink, Sclera-Non-icteric Neck: No JVD, No bruit, Trachea midline. Lungs:  Clear, Bilateral. Cardiac:  Regular rhythm, normal S1 and S2, no S3. II/VI systolic murmur. Abdomen:  Soft, non-tender. BS present. Extremities:  No edema present. No cyanosis. No clubbing. CNS: AxOx3, Cranial nerves grossly intact, moves all 4 extremities.  Skin: Warm and dry.   Intake/Output from previous day: 11/07 0701 - 11/08 0700 In: 1000 [IV Piggyback:1000] Out: 200 [Stool:200]    Lab Results: BMET    Component Value Date/Time   NA 134 (L) 12/06/2015 0353   NA 136 12/05/2015 1349   NA 137 10/06/2015 0920   K 3.2 (L) 12/06/2015 0353   K 3.3 (L) 12/05/2015 1349   K 4.1 10/06/2015 0920   CL 106 12/06/2015 0353   CL 105 12/05/2015 1349   CL 103 10/06/2015 0920   CO2 20 (L) 12/06/2015 0353   CO2 23 12/05/2015 1349   CO2 26 10/06/2015 0920   GLUCOSE 105 (H) 12/06/2015 0353   GLUCOSE 107 (H) 12/05/2015 1349   GLUCOSE 84 10/06/2015 0920   BUN 12 12/06/2015 0353   BUN 13 12/05/2015 1349   BUN 17 10/06/2015 0920   CREATININE 1.13 12/06/2015 0353   CREATININE 1.19 12/05/2015 1349   CREATININE 1.15 10/06/2015 0920   CREATININE 1.16 09/18/2015 1047   CREATININE 0.97 09/08/2015 0816   CREATININE 1.31 (H) 08/30/2015 1023   CALCIUM 7.8 (L) 12/06/2015 0353   CALCIUM 8.4 (L) 12/05/2015 1349   CALCIUM 8.8 10/06/2015 0920   GFRNONAA >60 12/06/2015 0353   GFRNONAA >60 12/05/2015 1349   GFRNONAA 65 10/06/2015 0920   GFRNONAA 64 09/18/2015 1047   GFRNONAA >60 09/08/2015 0816   GFRAA >60 12/06/2015 0353   GFRAA >60 12/05/2015 1349   GFRAA 75 10/06/2015 0920   GFRAA 74 09/18/2015 1047   GFRAA >60 09/08/2015 0816   CBC    Component Value Date/Time   WBC 8.8 12/06/2015 0353   RBC 3.38 (L) 12/06/2015 0353   HGB 10.2 (L) 12/06/2015 0353   HCT 29.9 (L) 12/06/2015 0353   PLT 150 12/06/2015 0353   MCV 88.5 12/06/2015 0353   MCV 88.6 10/06/2015 0933   MCH 30.2 12/06/2015 0353   MCHC 34.1 12/06/2015 0353   RDW 13.4 12/06/2015 0353   LYMPHSABS 1.8 09/08/2015 0819   MONOABS 0.9 09/08/2015 0819   EOSABS 0.1 09/08/2015 0819   BASOSABS 0.0 09/08/2015 0819   HEPATIC Function Panel  Recent Labs  09/04/15 2300 09/08/15 0816  PROT 7.6 8.0   HEMOGLOBIN A1C No components found for: HGA1C,  MPG CARDIAC ENZYMES Lab Results  Component Value Date   TROPONINI 0.54 (  HH) 12/05/2015   TROPONINI 0.01 08/31/2015   BNP No results for input(s): PROBNP in the last 8760 hours. TSH  Recent Labs  09/05/15 0159 09/08/15 0819 12/05/15 1349  TSH 6.195* 4.117 6.164*   CHOLESTEROL No results for input(s): CHOL in the last 8760 hours.  Scheduled Meds: . aspirin  324 mg Oral Once  . diltiazem  60 mg Oral Q8H  . potassium chloride  20 mEq Oral BID  . propranolol  40 mg Oral BID  . tamsulosin  0.4 mg Oral QPC supper   Continuous Infusions: . diltiazem (CARDIZEM) infusion 10 mg/hr (12/06/15 1354)  . heparin 900 Units/hr (12/06/15 1750)   PRN Meds:.acetaminophen **OR** acetaminophen  Assessment/Plan: Atrial fibrillation with RVR-resolved, CHA2DS2VASc score 1 Chest pain  Diarrhea r/o C. Difficile   Change  to PO diltiazem.   LOS: 0 days    [name]  MD  12/06/2015, 8:36 PM

## 2015-12-06 NOTE — Progress Notes (Signed)
ANTICOAGULATION CONSULT NOTE - Follow Up Consult  Pharmacy Consult for heparin Indication: atrial fibrillation  Labs:  Recent Labs  12/05/15 1349 12/06/15 0353  HGB 11.8* 10.2*  HCT 35.0* 29.9*  PLT 166 150  LABPROT 14.7 15.6*  INR 1.15 1.23  HEPARINUNFRC  --  0.32  CREATININE 1.19 1.13  TROPONINI 0.54*  --     Assessment: 70yo male therapeutic on heparin with initial dosing for Afib though at very low end of goal.  Goal of Therapy:  Heparin level 0.3-0.7 units/ml   Plan:  Will increase heparin gtt slightly to 900 units/hr to prevent dropping below goal and check level in 6hr.  Brandon GamblesVeronda Abigayle Ho, PharmD, BCPS  12/06/2015,5:33 AM

## 2015-12-06 NOTE — Progress Notes (Signed)
  Echocardiogram 2D Echocardiogram has been performed.  Leta JunglingCooper, Keno Caraway M 12/06/2015, 2:54 PM

## 2015-12-07 LAB — BASIC METABOLIC PANEL
ANION GAP: 8 (ref 5–15)
BUN: 7 mg/dL (ref 6–20)
CALCIUM: 8.5 mg/dL — AB (ref 8.9–10.3)
CO2: 23 mmol/L (ref 22–32)
CREATININE: 1.22 mg/dL (ref 0.61–1.24)
Chloride: 107 mmol/L (ref 101–111)
GFR, EST NON AFRICAN AMERICAN: 59 mL/min — AB (ref >60)
Glucose, Bld: 111 mg/dL — ABNORMAL HIGH (ref 65–99)
Potassium: 3.5 mmol/L (ref 3.5–5.1)
SODIUM: 138 mmol/L (ref 135–145)

## 2015-12-07 LAB — CBC
HCT: 33.7 % — ABNORMAL LOW (ref 39.0–52.0)
Hemoglobin: 11.4 g/dL — ABNORMAL LOW (ref 13.0–17.0)
MCH: 30.5 pg (ref 26.0–34.0)
MCHC: 33.8 g/dL (ref 30.0–36.0)
MCV: 90.1 fL (ref 78.0–100.0)
PLATELETS: 159 10*3/uL (ref 150–400)
RBC: 3.74 MIL/uL — ABNORMAL LOW (ref 4.22–5.81)
RDW: 13.4 % (ref 11.5–15.5)
WBC: 7.5 10*3/uL (ref 4.0–10.5)

## 2015-12-07 LAB — HEPARIN LEVEL (UNFRACTIONATED): HEPARIN UNFRACTIONATED: 0.29 [IU]/mL — AB (ref 0.30–0.70)

## 2015-12-07 MED ORDER — RIVAROXABAN 20 MG PO TABS
20.0000 mg | ORAL_TABLET | Freq: Every day | ORAL | Status: DC
  Administered 2015-12-07 – 2015-12-09 (×3): 20 mg via ORAL
  Filled 2015-12-07 (×3): qty 1

## 2015-12-07 NOTE — Progress Notes (Signed)
Ref: Brandon Ho, STEVE A, MD   Subjective:  Feeling same. Diarrhea continues. VS stable. On PO diltiazem. Not quite ready for cardiac cath.   Objective:  Vital Signs in the last 24 hours: Temp:  [97.7 F (36.5 C)-98 F (36.7 C)] 97.7 F (36.5 C) (11/09 1410) Pulse Rate:  [65-72] 66 (11/09 1410) Cardiac Rhythm: Normal sinus rhythm (11/09 0700) Resp:  [18] 18 (11/09 1410) BP: (107-129)/(52-57) 129/55 (11/09 1410) SpO2:  [96 %-100 %] 100 % (11/09 1410) Weight:  [61.8 kg (136 lb 4.8 oz)] 61.8 kg (136 lb 4.8 oz) (11/09 0510)  Physical Exam: BP Readings from Last 1 Encounters:  12/07/15 (!) 129/55    Wt Readings from Last 1 Encounters:  12/07/15 61.8 kg (136 lb 4.8 oz)    Weight change: -0.771 kg (-1 lb 11.2 oz) Body mass index is 22.68 kg/m. HEENT: [place]/AT, Eyes- PERL, EOMI, Conjunctiva-Pink, Sclera-Non-icteric Neck: No JVD, No bruit, Trachea midline. Lungs:  Clear, Bilateral. Cardiac:  Regular rhythm, normal S1 and S2, no S3. II/VI systolic murmur. Abdomen:  Soft, non-tender. BS present. Extremities:  No edema present. No cyanosis. No clubbing. CNS: AxOx3, Cranial nerves grossly intact, moves all 4 extremities.  Skin: Warm and dry.   Intake/Output from previous day: 11/08 0701 - 11/09 0700 In: 600 [P.O.:600] Out: 6 [Urine:3; Stool:3]    Lab Results: BMET    Component Value Date/Time   NA 138 12/07/2015 0208   NA 134 (L) 12/06/2015 0353   NA 136 12/05/2015 1349   K 3.5 12/07/2015 0208   K 3.2 (L) 12/06/2015 0353   K 3.3 (L) 12/05/2015 1349   CL 107 12/07/2015 0208   CL 106 12/06/2015 0353   CL 105 12/05/2015 1349   CO2 23 12/07/2015 0208   CO2 20 (L) 12/06/2015 0353   CO2 23 12/05/2015 1349   GLUCOSE 111 (H) 12/07/2015 0208   GLUCOSE 105 (H) 12/06/2015 0353   GLUCOSE 107 (H) 12/05/2015 1349   BUN 7 12/07/2015 0208   BUN 12 12/06/2015 0353   BUN 13 12/05/2015 1349   CREATININE 1.22 12/07/2015 0208   CREATININE 1.13 12/06/2015 0353   CREATININE 1.19 12/05/2015 1349    CREATININE 1.15 10/06/2015 0920   CREATININE 1.16 09/18/2015 1047   CREATININE 1.31 (H) 08/30/2015 1023   CALCIUM 8.5 (L) 12/07/2015 0208   CALCIUM 7.8 (L) 12/06/2015 0353   CALCIUM 8.4 (L) 12/05/2015 1349   GFRNONAA 59 (L) 12/07/2015 0208   GFRNONAA >60 12/06/2015 0353   GFRNONAA >60 12/05/2015 1349   GFRNONAA 65 10/06/2015 0920   GFRNONAA 64 09/18/2015 1047   GFRAA >60 12/07/2015 0208   GFRAA >60 12/06/2015 0353   GFRAA >60 12/05/2015 1349   GFRAA 75 10/06/2015 0920   GFRAA 74 09/18/2015 1047   CBC    Component Value Date/Time   WBC 7.5 12/07/2015 0208   RBC 3.74 (L) 12/07/2015 0208   HGB 11.4 (L) 12/07/2015 0208   HCT 33.7 (L) 12/07/2015 0208   PLT 159 12/07/2015 0208   MCV 90.1 12/07/2015 0208   MCV 88.6 10/06/2015 0933   MCH 30.5 12/07/2015 0208   MCHC 33.8 12/07/2015 0208   RDW 13.4 12/07/2015 0208   LYMPHSABS 1.8 09/08/2015 0819   MONOABS 0.9 09/08/2015 0819   EOSABS 0.1 09/08/2015 0819   BASOSABS 0.0 09/08/2015 0819   HEPATIC Function Panel  Recent Labs  09/04/15 2300 09/08/15 0816  PROT 7.6 8.0   HEMOGLOBIN A1C No components found for: HGA1C,  MPG CARDIAC ENZYMES Lab  Results  Component Value Date   TROPONINI 0.54 (HH) 12/05/2015   TROPONINI 0.01 08/31/2015   BNP No results for input(s): PROBNP in the last 8760 hours. TSH  Recent Labs  09/05/15 0159 09/08/15 0819 12/05/15 1349  TSH 6.195* 4.117 6.164*   CHOLESTEROL No results for input(s): CHOL in the last 8760 hours.  Scheduled Meds: . aspirin  324 mg Oral Once  . diltiazem  60 mg Oral Q8H  . propranolol  40 mg Oral BID  . tamsulosin  0.4 mg Oral QPC supper  . vancomycin  125 mg Oral QID   Continuous Infusions: . heparin 1,000 Units/hr (12/07/15 1125)   PRN Meds:.acetaminophen **OR** acetaminophen  Assessment/Plan: Paroxysmal atrial fibrillation Sinus rhythm. Chest pain. C.Difficile colitis with diarrhea  Postpone cardiac cath per patient request. Start Xarelto. Increase  activity. Home soon.   LOS: 1 day    Brandon CobbAjay Sharesa Kemp  MD  12/07/2015, 2:31 PM

## 2015-12-07 NOTE — Progress Notes (Addendum)
Addendum: Cardiac cath is being postponed. New orders received to start xarelto for afib. Will d/c heparin at the time of xarelto administration.  ANTICOAGULATION CONSULT NOTE - Follow Up Consult  Pharmacy Consult for Heparin Indication: atrial fibrillation   Assessment: 70 yr old male on IV heparin for atrial fibrillation.  Heparin level is just below goal at 0.29 on 900 units/hr. No bleeding issues noted, will make small rate adjustment.   Goal of Therapy:  Heparin level 0.3-0.7 units/ml Monitor platelets by anticoagulation protocol: Yes   Plan:  Increase heparin drip to 1000 units/hr. Daily heparin level and CBC while on heparin. Follow up plan for oral anticoagulation  Allergies  Allergen Reactions  . Chlorhexidine Other (See Comments)    Mouth tingling  . Mouth Rinse [Cetylpyridinium Chloride]     Facial tingling   . Other Other (See Comments)    Angel food cake    Patient Measurements: Height: 5\' 5"  (165.1 cm) Weight: 136 lb 4.8 oz (61.8 kg) IBW/kg (Calculated) : 61.5 Heparin Dosing Weight: 62 kg  Vital Signs: Temp: 98 F (36.7 C) (11/09 0510) Temp Source: Oral (11/09 0510) BP: 107/57 (11/09 0510) Pulse Rate: 65 (11/09 0510)  Labs:  Recent Labs  12/05/15 1349 12/06/15 0353 12/06/15 1201 12/07/15 0208  HGB 11.8* 10.2*  --  11.4*  HCT 35.0* 29.9*  --  33.7*  PLT 166 150  --  159  LABPROT 14.7 15.6*  --   --   INR 1.15 1.23  --   --   HEPARINUNFRC  --  0.32 0.38 0.29*  CREATININE 1.19 1.13  --  1.22  TROPONINI 0.54*  --   --   --     Estimated Creatinine Clearance: 49.7 mL/min (by C-G formula based on SCr of 1.22 mg/dL).  Sheppard CoilFrank Addysyn Fern PharmD., BCPS Clinical Pharmacist Pager [phone] 12/07/2015 10:18 AM

## 2015-12-07 NOTE — Care Management Note (Addendum)
Case Management Note Brandon PieriniKristi Araceli Arango RN, BSN Unit 2W-Case Manager [phone]  Patient Details  Name: Brandon CoffinCharles W Ho MRN: [id number] Date of Birth: 04/29/1945  Subjective/Objective:    Pt admitted with afib +cdiff                Action/Plan: PTA pt lived at home alone referral received from pharmacy that pt had been started on Xarelto- insurance check attempted- pt only showing Medicare Part A- spoke with pt at bedside- pt confirmed that he does not have any prescription drug coverage- NO part D- pt pays out of pocket for medications- encouraged pt to look into prescription drug plan- now during open enrollment- especially if he is to be on Xarelto long-term. Pt can use 30 day free card on discharge and apply for pt assistance- assistance form placed on Shadow chart for MD signature. Pt has not used MATCH in the past and would be [facility] assistance if needed- CM to follow for any further medication needs on discharge.   Expected Discharge Date:                  Expected Discharge Plan:  Home/Self Care  In-House Referral:     Discharge planning Services  CM Consult, Medication Assistance  Post Acute Care Choice:    Choice offered to:     DME Arranged:    DME Agency:     HH Arranged:    HH Agency:     Status of Service:  In process, will continue to follow  If discussed at Long Length of Stay Meetings, dates discussed:    Additional Comments:  12/08/15- 1330- Brandon PieriniKristi Kayse Puccini RN, CM- spoke with pt again at bedside- 30 day free card given to pt for Xarelto- pt assistance form on chart for MD signature- pt aware to ask for form prior to discharge if MD as not signed pt can f/u with his PCP to fill form out. CM will need to f/u with pt prior to discharge for any further medication assistance needs.   Darrold SpanWebster, Zenas [place], RN 12/07/2015, 3:58 PM

## 2015-12-08 LAB — BASIC METABOLIC PANEL
ANION GAP: 5 (ref 5–15)
BUN: 7 mg/dL (ref 6–20)
CALCIUM: 8.2 mg/dL — AB (ref 8.9–10.3)
CO2: 23 mmol/L (ref 22–32)
Chloride: 110 mmol/L (ref 101–111)
Creatinine, Ser: 1.26 mg/dL — ABNORMAL HIGH (ref 0.61–1.24)
GFR calc Af Amer: >60 mL/min (ref >60)
GFR, EST NON AFRICAN AMERICAN: 57 mL/min — AB (ref >60)
Glucose, Bld: 118 mg/dL — ABNORMAL HIGH (ref 65–99)
POTASSIUM: 3.6 mmol/L (ref 3.5–5.1)
SODIUM: 138 mmol/L (ref 135–145)

## 2015-12-08 LAB — CBC
HEMATOCRIT: 31.4 % — AB (ref 39.0–52.0)
Hemoglobin: 10.3 g/dL — ABNORMAL LOW (ref 13.0–17.0)
MCH: 29.9 pg (ref 26.0–34.0)
MCHC: 32.8 g/dL (ref 30.0–36.0)
MCV: 91 fL (ref 78.0–100.0)
Platelets: 138 10*3/uL — ABNORMAL LOW (ref 150–400)
RBC: 3.45 MIL/uL — ABNORMAL LOW (ref 4.22–5.81)
RDW: 13.7 % (ref 11.5–15.5)
WBC: 5.9 10*3/uL (ref 4.0–10.5)

## 2015-12-08 LAB — TROPONIN I: TROPONIN I: 0.73 ng/mL — AB (ref <0.03)

## 2015-12-08 NOTE — Progress Notes (Signed)
Ref: Lucilla EdinAUB, STEVE A, MD   Subjective:  Diarrhea continues. Abnormal Troponin-I. Wants to wait for cardiac cath.  Objective:  Vital Signs in the last 24 hours: Temp:  [97.6 F (36.4 C)-97.7 F (36.5 C)] 97.6 F (36.4 C) (11/10 0551) Pulse Rate:  [65-69] 65 (11/10 0551) Cardiac Rhythm: Normal sinus rhythm (11/10 0700) Resp:  [18] 18 (11/10 0551) BP: (121-129)/(55-56) 128/56 (11/10 0551) SpO2:  [98 %-100 %] 98 % (11/10 0551) Weight:  [61.2 kg (134 lb 14.4 oz)] 61.2 kg (134 lb 14.4 oz) (11/10 0551)  Physical Exam: BP Readings from Last 1 Encounters:  12/08/15 (!) 128/56    Wt Readings from Last 1 Encounters:  12/08/15 61.2 kg (134 lb 14.4 oz)    Weight change: -0.635 kg (-1 lb 6.4 oz) Body mass index is 22.45 kg/m. HEENT: [place]/AT, Eyes- PERL, EOMI, Conjunctiva-Pink, Sclera-Non-icteric Neck: No JVD, No bruit, Trachea midline. Lungs:  Clear, Bilateral. Cardiac:  Regular rhythm, normal S1 and S2, no S3. II/VI systolic murmur. Abdomen:  Soft, non-tender. BS present. Extremities:  No edema present. No cyanosis. No clubbing. CNS: AxOx3, Cranial nerves grossly intact, moves all 4 extremities.  Skin: Warm and dry.   Intake/Output from previous day: 11/09 0701 - 11/10 0700 In: 680 [P.O.:680] Out: 1 [Stool:1]    Lab Results: BMET    Component Value Date/Time   NA 138 12/08/2015 0217   NA 138 12/07/2015 0208   NA 134 (L) 12/06/2015 0353   K 3.6 12/08/2015 0217   K 3.5 12/07/2015 0208   K 3.2 (L) 12/06/2015 0353   CL 110 12/08/2015 0217   CL 107 12/07/2015 0208   CL 106 12/06/2015 0353   CO2 23 12/08/2015 0217   CO2 23 12/07/2015 0208   CO2 20 (L) 12/06/2015 0353   GLUCOSE 118 (H) 12/08/2015 0217   GLUCOSE 111 (H) 12/07/2015 0208   GLUCOSE 105 (H) 12/06/2015 0353   BUN 7 12/08/2015 0217   BUN 7 12/07/2015 0208   BUN 12 12/06/2015 0353   CREATININE 1.26 (H) 12/08/2015 0217   CREATININE 1.22 12/07/2015 0208   CREATININE 1.13 12/06/2015 0353   CREATININE 1.15 10/06/2015  0920   CREATININE 1.16 09/18/2015 1047   CREATININE 1.31 (H) 08/30/2015 1023   CALCIUM 8.2 (L) 12/08/2015 0217   CALCIUM 8.5 (L) 12/07/2015 0208   CALCIUM 7.8 (L) 12/06/2015 0353   GFRNONAA 57 (L) 12/08/2015 0217   GFRNONAA 59 (L) 12/07/2015 0208   GFRNONAA >60 12/06/2015 0353   GFRNONAA 65 10/06/2015 0920   GFRNONAA 64 09/18/2015 1047   GFRAA >60 12/08/2015 0217   GFRAA >60 12/07/2015 0208   GFRAA >60 12/06/2015 0353   GFRAA 75 10/06/2015 0920   GFRAA 74 09/18/2015 1047   CBC    Component Value Date/Time   WBC 5.9 12/08/2015 0217   RBC 3.45 (L) 12/08/2015 0217   HGB 10.3 (L) 12/08/2015 0217   HCT 31.4 (L) 12/08/2015 0217   PLT 138 (L) 12/08/2015 0217   MCV 91.0 12/08/2015 0217   MCV 88.6 10/06/2015 0933   MCH 29.9 12/08/2015 0217   MCHC 32.8 12/08/2015 0217   RDW 13.7 12/08/2015 0217   LYMPHSABS 1.8 09/08/2015 0819   MONOABS 0.9 09/08/2015 0819   EOSABS 0.1 09/08/2015 0819   BASOSABS 0.0 09/08/2015 0819   HEPATIC Function Panel  Recent Labs  09/04/15 2300 09/08/15 0816  PROT 7.6 8.0   HEMOGLOBIN A1C No components found for: HGA1C,  MPG CARDIAC ENZYMES Lab Results  Component Value Date  TROPONINI 0.73 (HH) 12/08/2015   TROPONINI 0.54 (HH) 12/05/2015   TROPONINI 0.01 08/31/2015   BNP No results for input(s): PROBNP in the last 8760 hours. TSH  Recent Labs  09/05/15 0159 09/08/15 0819 12/05/15 1349  TSH 6.195* 4.117 6.164*   CHOLESTEROL No results for input(s): CHOL in the last 8760 hours.  Scheduled Meds: . aspirin  324 mg Oral Once  . diltiazem  60 mg Oral Q8H  . propranolol  40 mg Oral BID  . rivaroxaban  20 mg Oral Q supper  . tamsulosin  0.4 mg Oral QPC supper  . vancomycin  125 mg Oral QID   Continuous Infusions: PRN Meds:.acetaminophen **OR** acetaminophen  Assessment/Plan: Paroxysmal atrial fibrillation C. Difficile colitis  Continue medical treatment. Increase activity.   LOS: 2 days    Orpah CobbAjay Nazli Penn  MD  12/08/2015, 9:40  AM

## 2015-12-08 NOTE — Progress Notes (Signed)
Page Dr. Algie CofferKadakia abnl Trop 0.73 patient voice no complaints

## 2015-12-08 NOTE — Progress Notes (Signed)
Dr. Algie CofferKadakia stated patient can eat, heart healthy diet,  orders placed will monitor patient. Brandon Ho, Randall AnKristin Jessup RN

## 2015-12-09 NOTE — Progress Notes (Signed)
Patient sitting on side of bed, no needs at this time, call light within reach

## 2015-12-09 NOTE — Progress Notes (Signed)
Ref: Brandon EdinAUB, Brandon A, MD   Subjective:  Feeling better. Diarrhea decreasing.   Objective:  Vital Signs in the last 24 hours: Temp:  [97.9 F (36.6 C)-98.1 F (36.7 C)] 98.1 F (36.7 C) (11/11 0446) Pulse Rate:  [62-67] 67 (11/11 0446) Cardiac Rhythm: Sinus bradycardia (11/11 0700) Resp:  [18-20] 18 (11/11 0446) BP: (106-126)/(47-62) 122/62 (11/11 0915) SpO2:  [99 %-100 %] 99 % (11/11 0446) Weight:  [61.2 kg (135 lb)] 61.2 kg (135 lb) (11/11 0446)  Physical Exam: BP Readings from Last 1 Encounters:  12/09/15 122/62    Wt Readings from Last 1 Encounters:  12/09/15 61.2 kg (135 lb)    Weight change: 0.045 kg (1.6 oz) Body mass index is 22.47 kg/m. HEENT: [place]/AT, Eyes- PERL, EOMI, Conjunctiva-Pink, Sclera-Non-icteric Neck: No JVD, No bruit, Trachea midline. Lungs:  Clear, Bilateral. Cardiac:  Regular rhythm, normal S1 and S2, no S3. II/VI systolic murmur. Abdomen:  Soft, non-tender. BS present. Extremities:  No edema present. No cyanosis. No clubbing. CNS: AxOx3, Cranial nerves grossly intact, moves all 4 extremities.  Skin: Warm and dry.   Intake/Output from previous day: 11/10 0701 - 11/11 0700 In: 240 [P.O.:240] Out: -     Lab Results: BMET    Component Value Date/Time   NA 138 12/08/2015 0217   NA 138 12/07/2015 0208   NA 134 (L) 12/06/2015 0353   K 3.6 12/08/2015 0217   K 3.5 12/07/2015 0208   K 3.2 (L) 12/06/2015 0353   CL 110 12/08/2015 0217   CL 107 12/07/2015 0208   CL 106 12/06/2015 0353   CO2 23 12/08/2015 0217   CO2 23 12/07/2015 0208   CO2 20 (L) 12/06/2015 0353   GLUCOSE 118 (H) 12/08/2015 0217   GLUCOSE 111 (H) 12/07/2015 0208   GLUCOSE 105 (H) 12/06/2015 0353   BUN 7 12/08/2015 0217   BUN 7 12/07/2015 0208   BUN 12 12/06/2015 0353   CREATININE 1.26 (H) 12/08/2015 0217   CREATININE 1.22 12/07/2015 0208   CREATININE 1.13 12/06/2015 0353   CREATININE 1.15 10/06/2015 0920   CREATININE 1.16 09/18/2015 1047   CREATININE 1.31 (H) 08/30/2015 1023    CALCIUM 8.2 (L) 12/08/2015 0217   CALCIUM 8.5 (L) 12/07/2015 0208   CALCIUM 7.8 (L) 12/06/2015 0353   GFRNONAA 57 (L) 12/08/2015 0217   GFRNONAA 59 (L) 12/07/2015 0208   GFRNONAA >60 12/06/2015 0353   GFRNONAA 65 10/06/2015 0920   GFRNONAA 64 09/18/2015 1047   GFRAA >60 12/08/2015 0217   GFRAA >60 12/07/2015 0208   GFRAA >60 12/06/2015 0353   GFRAA 75 10/06/2015 0920   GFRAA 74 09/18/2015 1047   CBC    Component Value Date/Time   WBC 5.9 12/08/2015 0217   RBC 3.45 (L) 12/08/2015 0217   HGB 10.3 (L) 12/08/2015 0217   HCT 31.4 (L) 12/08/2015 0217   PLT 138 (L) 12/08/2015 0217   MCV 91.0 12/08/2015 0217   MCV 88.6 10/06/2015 0933   MCH 29.9 12/08/2015 0217   MCHC 32.8 12/08/2015 0217   RDW 13.7 12/08/2015 0217   LYMPHSABS 1.8 09/08/2015 0819   MONOABS 0.9 09/08/2015 0819   EOSABS 0.1 09/08/2015 0819   BASOSABS 0.0 09/08/2015 0819   HEPATIC Function Panel  Recent Labs  09/04/15 2300 09/08/15 0816  PROT 7.6 8.0   HEMOGLOBIN A1C No components found for: HGA1C,  MPG CARDIAC ENZYMES Lab Results  Component Value Date   TROPONINI 0.73 (HH) 12/08/2015   TROPONINI 0.54 (HH) 12/05/2015   TROPONINI  0.01 08/31/2015   BNP No results for input(s): PROBNP in the last 8760 hours. TSH  Recent Labs  09/05/15 0159 09/08/15 0819 12/05/15 1349  TSH 6.195* 4.117 6.164*   CHOLESTEROL No results for input(s): CHOL in the last 8760 hours.  Scheduled Meds: . aspirin  324 mg Oral Once  . diltiazem  60 mg Oral Q8H  . propranolol  40 mg Oral BID  . rivaroxaban  20 mg Oral Q supper  . tamsulosin  0.4 mg Oral QPC supper  . vancomycin  125 mg Oral QID   Continuous Infusions: PRN Meds:.acetaminophen **OR** acetaminophen  Assessment/Plan: Paroxysmal atrial fibrillation C. Difficile colitis  Continue medical treatment.   LOS: 3 days    Brandon CobbAjay Ravis Herne  MD  12/09/2015, 10:46 AM    Feeling better.

## 2015-12-09 NOTE — Progress Notes (Signed)
Pt ambulated this pm approximately 1500 ft. Tolerated well, no SOB. On RA, no assistive device used.

## 2015-12-10 MED ORDER — RIVAROXABAN 20 MG PO TABS: 20.0000 mg | ORAL_TABLET | Freq: Every day | ORAL | 3 refills | Status: AC

## 2015-12-10 MED ORDER — DILTIAZEM HCL 90 MG PO TABS: 90.0000 mg | ORAL_TABLET | Freq: Two times a day (BID) | ORAL | 3 refills | Status: AC

## 2015-12-10 MED ORDER — VANCOMYCIN 50 MG/ML ORAL SOLUTION: 125.0000 mg | Freq: Four times a day (QID) | ORAL | 0 refills | Status: AC

## 2015-12-10 NOTE — Discharge Summary (Signed)
Physician Discharge Summary  Patient ID: Brandon CoffinCharles W Ho MRN: [id number] DOB/AGE: 09/28/1945 70 y.o.  Admit date: 12/05/2015 Discharge date: 12/10/2015  Admission Diagnoses: Atrial fibrillation with rapid ventricular response [id number] score 1 Chest pain Diarrhea Discharge Diagnoses:  Principle Problem: * Atrial fibrillation with RVR (HCC)-resolved/Paroxysmal * Active Problem:   C. Difficile colitis-resolving   Unstable angina   Abnormal Troponin I, probably demand ischemia  Discharged Condition: fair  Hospital Course: 70 year old male with 3 day history of diarrhea, chest pain and palpitation had atrial fibrillation with RVR discovered at Urgent care center. He responded well to IV diltiazem along with his previous use of propranolol. His diarrhea improved with oral vancomycin every 6 hour use for C. Difficile colitis. He wants cardiac interventions postponed for now. He will see primary care in 1 month and me in 4 days.   Consults: cardiology  Significant Diagnostic Studies: labs: Near normal CBC except Hgb of 11.8. BMET showed mild hypokalemia (improved with oral potassium)and minimal hyperglycemia. TSH borderline   EKG-Atrial fibrillation with RVR. To NSR.  Echocardiogram: Mild LVH and normal LV systolic function.  Treatments: antibiotics: vancomycin. Cardiac medications: Propranolol, diltiazem and Xarelto.  Discharge Exam: Blood pressure (!) 109/53, pulse 66, temperature 98.3 F (36.8 C), temperature source Oral, resp. rate 18, height 5\' 5"  (1.651 m), weight 61.1 kg (134 lb 11.2 oz), SpO2 98 %. General appearance: alert, cooperative, appears stated age and no distress Head: Normocephalic, atraumatic Eyes: conjunctivae/corneas clear. PERRL, EOM'Ho intact.  Neck: Noo adenopathy, no carotid bruit, no JVD, supple. Resp: Clear to auscultation bilaterally. Cardio: Regular rate and rhythm, S1, S2 normal, no murmur, click, rub or gallop GI: soft, non-tender; bowel sounds  normal. Extremities: No cyanosis or edema. Skin: Warm and dry. No rashes or lesions Neurologic: Alert and oriented X 3, normal strength and tone. Normal coordination and gait  Disposition: 01-Home or Self Care     Medication List    STOP taking these medications   amLODipine 5 MG tablet Commonly known as:  NORVASC   aspirin EC 81 MG tablet   Potassium Chloride ER 20 MEQ Tbcr     TAKE these medications   diltiazem 90 MG tablet Commonly known as:  CARDIZEM Take 1 tablet (90 mg total) by mouth 2 (two) times daily.   feeding supplement (ENSURE ENLIVE) Liqd Take 237 mLs by mouth 2 (two) times daily between meals.   propranolol 40 MG tablet Commonly known as:  INDERAL Take 40 mg by mouth 2 (two) times daily.   rivaroxaban 20 MG Tabs tablet Commonly known as:  XARELTO Take 1 tablet (20 mg total) by mouth daily with supper.   tamsulosin 0.4 MG Caps capsule Commonly known as:  FLOMAX Take 1 capsule (0.4 mg total) by mouth daily after supper.   vancomycin 50 mg/mL oral solution Commonly known as:  VANCOCIN Take 2.5 mLs (125 mg total) by mouth 4 (four) times daily.      Follow-up Information    DAUB, STEVE A, MD. Schedule an appointment as soon as possible for a visit in 1 month(Ho).   Specialty:  Family Medicine Contact information: [address] [place] [address] [phone]        Brandon RodriguezKADAKIA,[name], MD. Schedule an appointment as soon as possible for a visit in 4 day(Ho).   Specialty:  Cardiology Contact information: [address] [place] [address] [phone]           Signed: [name],Brandon Ho 12/10/2015, 8:29 AM

## 2015-12-20 NOTE — Telephone Encounter (Signed)
error 

## 2016-03-07 ENCOUNTER — Encounter (HOSPITAL_COMMUNITY): Payer: Self-pay | Admitting: Emergency Medicine

## 2016-03-07 DIAGNOSIS — R197 Diarrhea, unspecified: Secondary | ICD-10-CM | POA: Insufficient documentation

## 2016-03-07 DIAGNOSIS — I10 Essential (primary) hypertension: Secondary | ICD-10-CM | POA: Insufficient documentation

## 2016-03-07 DIAGNOSIS — Z79899 Other long term (current) drug therapy: Secondary | ICD-10-CM | POA: Insufficient documentation

## 2016-03-07 LAB — CBC WITH DIFFERENTIAL/PLATELET
BASOS ABS: 0 10*3/uL (ref 0.0–0.1)
Basophils Relative: 0 %
Eosinophils Absolute: 0.1 10*3/uL (ref 0.0–0.7)
Eosinophils Relative: 1 %
HEMATOCRIT: 38 % — AB (ref 39.0–52.0)
HEMOGLOBIN: 13 g/dL (ref 13.0–17.0)
LYMPHS ABS: 1.8 10*3/uL (ref 0.7–4.0)
LYMPHS PCT: 23 %
MCH: 30.2 pg (ref 26.0–34.0)
MCHC: 34.2 g/dL (ref 30.0–36.0)
MCV: 88.2 fL (ref 78.0–100.0)
Monocytes Absolute: 0.6 10*3/uL (ref 0.1–1.0)
Monocytes Relative: 8 %
NEUTROS ABS: 5.3 10*3/uL (ref 1.7–7.7)
NEUTROS PCT: 68 %
PLATELETS: 196 10*3/uL (ref 150–400)
RBC: 4.31 MIL/uL (ref 4.22–5.81)
RDW: 13.8 % (ref 11.5–15.5)
WBC: 7.9 10*3/uL (ref 4.0–10.5)

## 2016-03-07 LAB — COMPREHENSIVE METABOLIC PANEL
ALK PHOS: 59 U/L (ref 38–126)
ALT: 15 U/L — AB (ref 17–63)
AST: 23 U/L (ref 15–41)
Albumin: 3.9 g/dL (ref 3.5–5.0)
Anion gap: 9 (ref 5–15)
BUN: 11 mg/dL (ref 6–20)
CALCIUM: 8.8 mg/dL — AB (ref 8.9–10.3)
CHLORIDE: 105 mmol/L (ref 101–111)
CO2: 24 mmol/L (ref 22–32)
CREATININE: 1.43 mg/dL — AB (ref 0.61–1.24)
GFR, EST AFRICAN AMERICAN: 56 mL/min — AB (ref >60)
GFR, EST NON AFRICAN AMERICAN: 48 mL/min — AB (ref >60)
Glucose, Bld: 97 mg/dL (ref 65–99)
Potassium: 3.5 mmol/L (ref 3.5–5.1)
Sodium: 138 mmol/L (ref 135–145)
Total Bilirubin: 0.8 mg/dL (ref 0.3–1.2)
Total Protein: 7.2 g/dL (ref 6.5–8.1)

## 2016-03-07 NOTE — ED Notes (Signed)
Warm blankets handed out and delay explanation provided to all patients in lobby.   

## 2016-03-07 NOTE — ED Triage Notes (Addendum)
Patient reports chronic diarrhea onset last month , denies emesis or fever , pt. stated history of C-diff. infection in the past .

## 2016-03-08 ENCOUNTER — Emergency Department (HOSPITAL_COMMUNITY)
Admission: EM | Admit: 2016-03-08 | Discharge: 2016-03-08 | Disposition: A | Discharge status: Home / Self Care | Payer: Medicare Other | Attending: Emergency Medicine | Admitting: Emergency Medicine

## 2016-03-08 DIAGNOSIS — R197 Diarrhea, unspecified: Secondary | ICD-10-CM

## 2016-03-08 HISTORY — DX: Unspecified atrial fibrillation: I48.91

## 2016-03-08 HISTORY — DX: Enterocolitis due to Clostridium difficile, not specified as recurrent: A04.72

## 2016-03-08 MED ORDER — VANCOMYCIN 50 MG/ML ORAL SOLUTION: 125.0000 mg | Freq: Four times a day (QID) | ORAL | 0 refills | Status: AC

## 2016-03-08 NOTE — ED Provider Notes (Signed)
[facility]-EMERGENCY DEPT Provider Note   CSN: [id number] Arrival date & time: 03/07/16  2153  By signing my name below, I, Arianna Nassar, attest that this documentation has been prepared under the direction and in the presence of Geoffery Lyons, MD.  Electronically Signed: Octavia Heir, ED Scribe. 03/08/16. 2:15 AM.    History   Chief Complaint Chief Complaint  Patient presents with  . Diarrhea   The history is provided by the patient. No language interpreter was used.  Diarrhea   This is a chronic problem. The current episode started more than 2 days ago. The problem has not changed since onset.The stool consistency is described as watery. There has been no fever. Pertinent negatives include no abdominal pain. His past medical history does not include irritable bowel syndrome, inflammatory bowel disease or short gut syndrome.   HPI Comments: Brandon Ho is a 71 y.o. male who has a PMhx of a-fib, c-diff, hypokalemia, hyponatremia, and HTN presents to the Emergency Department complaining of intermittent, chronic diarrhea x 10 days ago. He states having associated weight change noting he went from 141 lbs to 132 lbs in one month. He has not had an episode of diarrhea since 1 pm this afternoon due to not eating. Pt reports being hospitalized on 12/05/15 for atrial fibrillation and was discharged on 12/10/15. He says that during his admission, he was diagnosed with c-diff. Pt was prescribed vancomycin q6h for one week by his cardiologist Dr. Algie Coffer. He reports that his symptoms alleviated and about one week later his symptoms came back. Pt has been on two more rounds of vancomycin since his initial diagnosis of C-diff stating that each time he takes the antibiotics, his symptoms will alleviate and then come about ~ 7-8 days later. Pt says his last dose of antibiotics was on 01/19. He reports contacting Dr. Algie Coffer earlier today and was advised to come to the ED for further evaluation. Pt denies  blood in stool or abdominal pain.   Past Medical History:  Diagnosis Date  . Atrial fibrillation (HCC)   . C. difficile diarrhea   . Irregular heart beat 1975  . Tooth abscess     Patient Active Problem List   Diagnosis Date Noted  . Atrial fibrillation with RVR (HCC) 12/05/2015  . BPH (benign prostatic hyperplasia) 09/07/2015  . Protein-calorie malnutrition, severe 09/06/2015  . Hypokalemia 09/06/2015  . Dehydration with hyponatremia 09/06/2015  . Tooth abscess 09/06/2015  . Generalized weakness 09/05/2015  . Hyponatremia 09/05/2015  . Headache 09/05/2015  . Abnormal CT scan of head 09/02/2015  . Essential hypertension 08/31/2015    Past Surgical History:  Procedure Laterality Date  . CATARACT EXTRACTION W/ INTRAOCULAR LENS IMPLANT Bilateral        Home Medications    Prior to Admission medications   Medication Sig Start Date End Date Taking? Authorizing Provider  diltiazem (CARDIZEM) 90 MG tablet Take 1 tablet (90 mg total) by mouth 2 (two) times daily. 12/10/15   Orpah Cobb, MD  feeding supplement, ENSURE ENLIVE, (ENSURE ENLIVE) LIQD Take 237 mLs by mouth 2 (two) times daily between meals. 09/06/15   Nishant Dhungel, MD  propranolol (INDERAL) 40 MG tablet Take 40 mg by mouth 2 (two) times daily.     Historical Provider, MD  rivaroxaban (XARELTO) 20 MG TABS tablet Take 1 tablet (20 mg total) by mouth daily with supper. 12/10/15   Orpah Cobb, MD  tamsulosin (FLOMAX) 0.4 MG CAPS capsule Take 1 capsule (0.4 mg total) by mouth  daily after supper. 10/06/15   Collene GobbleSteven A Daub, MD    Family History Family History  Problem Relation Age of Onset  . Heart disease Mother   . Hypertension Mother   . Stroke Mother     Social History Social History  Substance Use Topics  . Smoking status: Never Smoker  . Smokeless tobacco: Never Used  . Alcohol use No     Allergies   Chlorhexidine; Mouth rinse [cetylpyridinium chloride]; and Other   Review of Systems Review of  Systems  Gastrointestinal: Positive for diarrhea. Negative for abdominal pain and blood in stool.     Physical Exam Updated Vital Signs BP 166/72 (BP Location: Left Arm)   Pulse 70   Temp 97.4 F (36.3 C) (Oral)   Resp 18   Ht 5\' 8"  (1.727 m)   Wt 132 lb 8 oz (60.1 kg)   SpO2 100%   BMI 20.15 kg/m   Physical Exam  Constitutional: He is oriented to person, place, and time. He appears well-developed and well-nourished.  HENT:  Head: Normocephalic and atraumatic.  Eyes: EOM are normal.  Neck: Normal range of motion.  Cardiovascular: Normal rate, regular rhythm, normal heart sounds and intact distal pulses.   Pulmonary/Chest: Effort normal and breath sounds normal. No respiratory distress.  Abdominal: Soft. He exhibits no distension. There is no tenderness.  Musculoskeletal: Normal range of motion.  Neurological: He is alert and oriented to person, place, and time.  Skin: Skin is warm and dry.  Psychiatric: He has a normal mood and affect. Judgment normal.  Nursing note and vitals reviewed.    ED Treatments / Results  DIAGNOSTIC STUDIES: Oxygen Saturation is 100% on RA, normal by my interpretation.  COORDINATION OF CARE:  2:02 AM Discussed treatment plan with pt at bedside and pt agreed to plan.  Labs (all labs ordered are listed, but only abnormal results are displayed) Labs Reviewed  CBC WITH DIFFERENTIAL/PLATELET - Abnormal; Notable for the following:       Result Value   HCT 38.0 (*)    All other components within normal limits  COMPREHENSIVE METABOLIC PANEL - Abnormal; Notable for the following:    Creatinine, Ser 1.43 (*)    Calcium 8.8 (*)    ALT 15 (*)    GFR calc non Af Amer 48 (*)    GFR calc Af Amer 56 (*)    All other components within normal limits    EKG  EKG Interpretation None       Radiology No results found.  Procedures Procedures (including critical care time)  Medications Ordered in ED Medications - No data to  display   Initial Impression / Assessment and Plan / ED Course  I have reviewed the triage vital signs and the nursing notes.  Pertinent labs & imaging results that were available during my care of the patient were reviewed by me and considered in my medical decision making (see chart for details).     Patient's symptoms most consistent with a relapse of C. difficile. He will be treated with oral vancomycin and when necessary return.  Final Clinical Impressions(s) / ED Diagnoses   Final diagnoses:  None   I personally performed the services described in this documentation, which was scribed in my presence. The recorded information has been reviewed and is accurate.   New Prescriptions New Prescriptions   No medications on file     Geoffery Lyonsouglas Ahlijah Raia, MD 03/08/16 [phone]

## 2016-03-08 NOTE — ED Notes (Signed)
Pt verbalized understanding discharge instructions and denies any further needs or questions at this time. VS stable, ambulatory and steady gait.   

## 2016-03-08 NOTE — Discharge Instructions (Signed)
Vancomycin as prescribed.  Return to the Emergency Department if you develop severe abdominal pain, high fevers, bloody stools, or other new and concerning symptoms.

## 2017-11-12 IMAGING — MR MR HEAD W/O CM
9 of 10 series · 38 of 48 positions shown · non-contrast
Comparison: CT head 09/02/2015

CLINICAL DATA: Headache

EXAM:
MRI HEAD WITHOUT CONTRAST
TECHNIQUE: Multiplanar, multiecho pulse sequences of the brain and surrounding
structures were obtained without intravenous contrast.

[Series 4: T1 · sagittal · 5.0mm · 0.47mm/px · 2 of 24 slices shown]
[im 1/24]
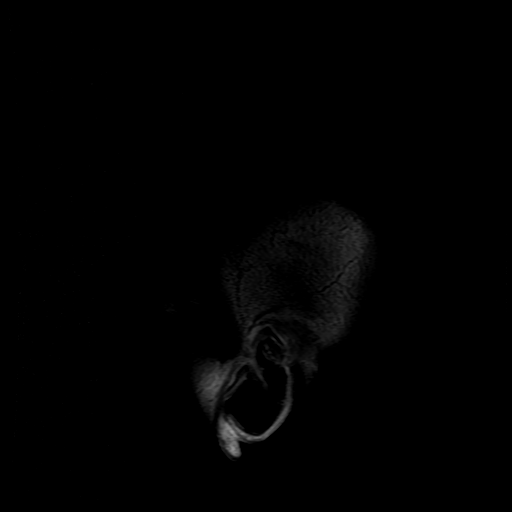
[im 24/24]
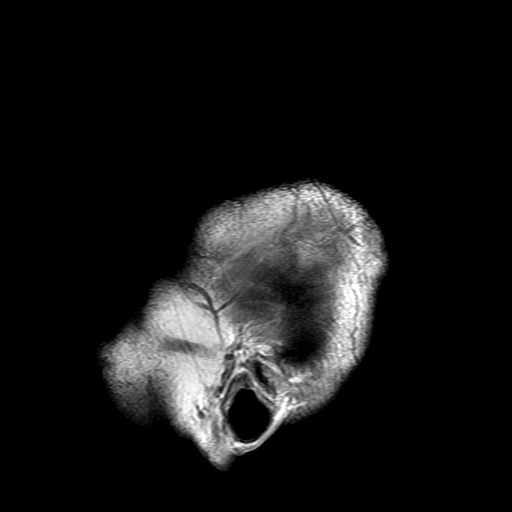

[Series 5: DWI · axial · 3.0mm · 1.09mm/px · z∈[+34,+187]mm · 9 of 108 slices shown (1 of 4)]
[im 1/108]
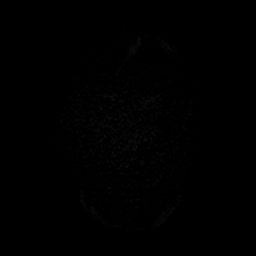
[im 20/108]
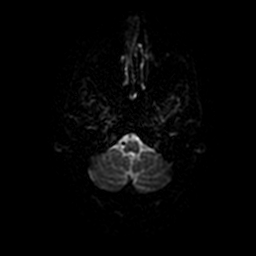
[im 30/108]
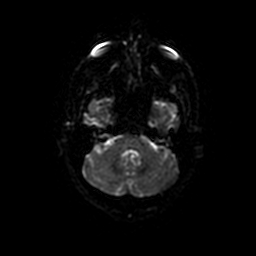
[im 49/108]
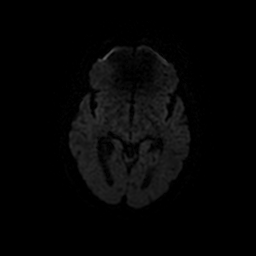
[im 59/108]
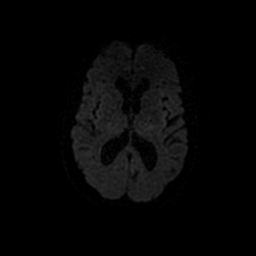
[im 78/108]
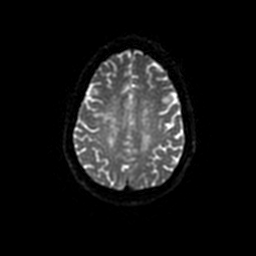
[im 88/108]
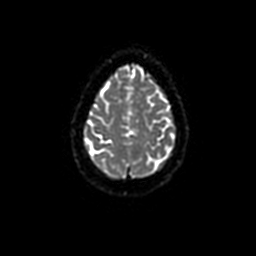
[im 98/108]
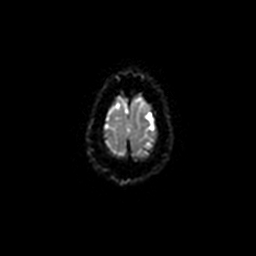
[im 108/108]
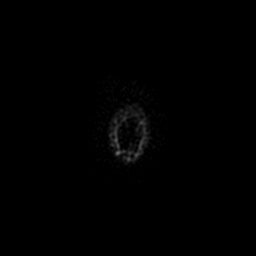

[Series 6: DWI · coronal · 5.0mm · 1.09mm/px · 8 of 72 slices shown (2 of 4)]
[im 1/72]
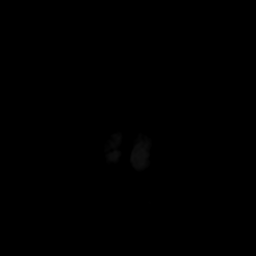
[im 11/72]
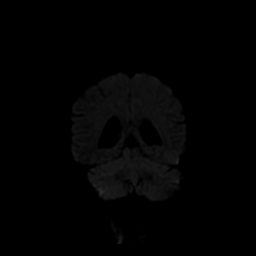
[im 21/72]
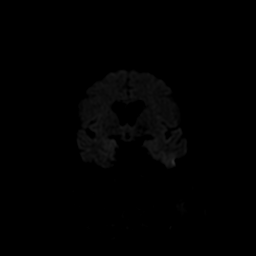
[im 31/72]
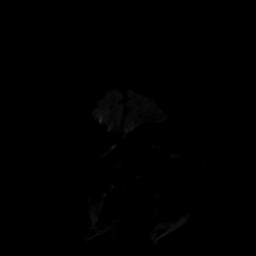
[im 41/72]
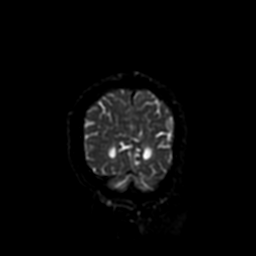
[im 51/72]
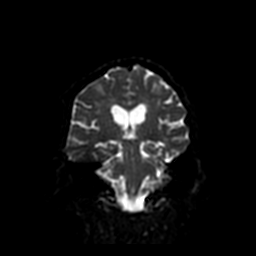
[im 61/72]
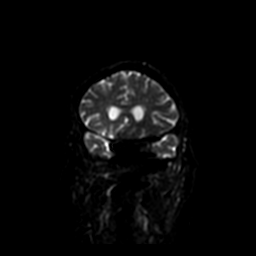
[im 72/72]
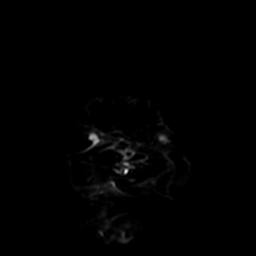

[Series 7: T2 · axial · 5.0mm · 0.43mm/px · z∈[+48,+194]mm · 3 of 25 slices shown (1 of 2)]
[im 1/25]
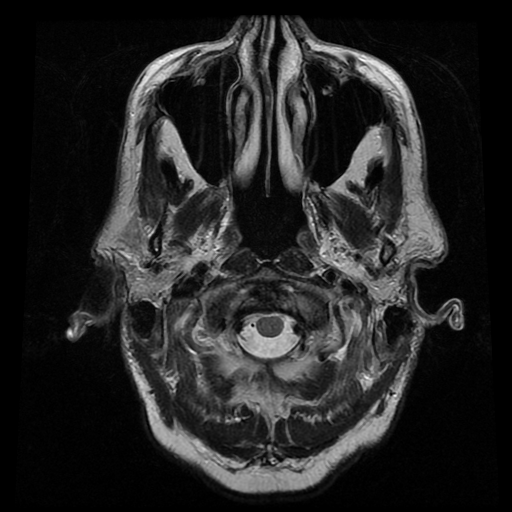
[im 13/25]
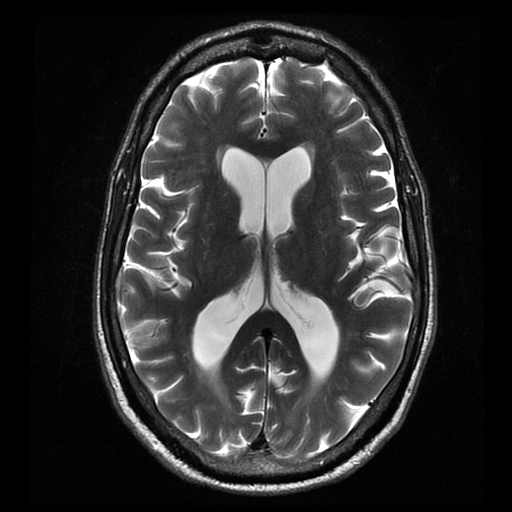
[im 25/25]
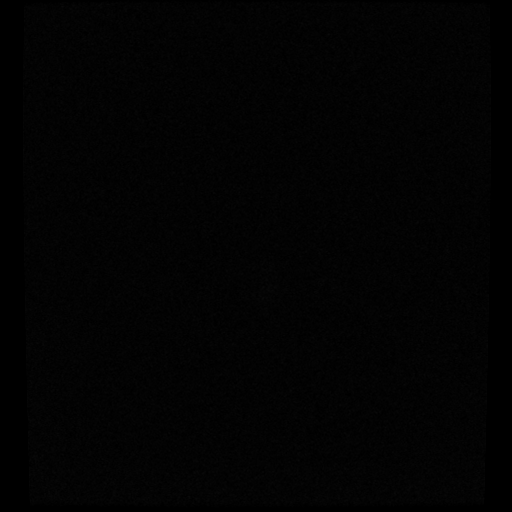

[Series 8: FLAIR · axial · 5.0mm · 0.43mm/px · z∈[+49,+193]mm · 2 of 23 slices shown]
[im 1/23]
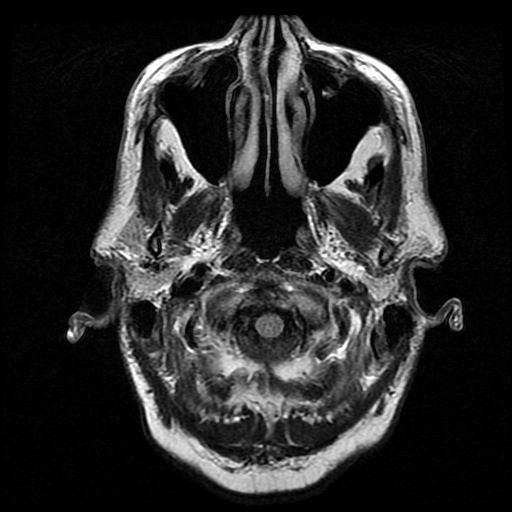
[im 23/23]
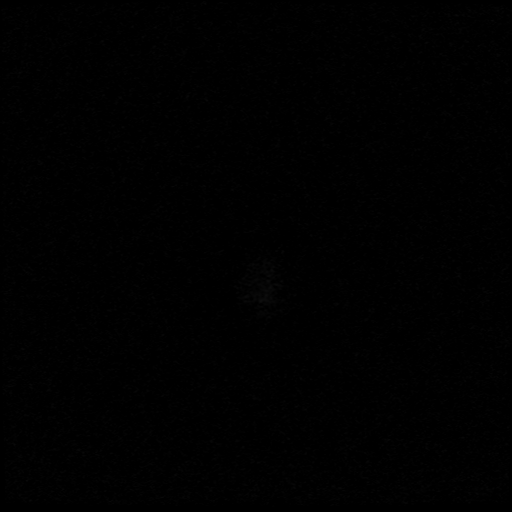

[Series 9: ax mpgr · axial · 5.0mm · 0.43mm/px · 1 of 23 slices shown]
[im 1/23]
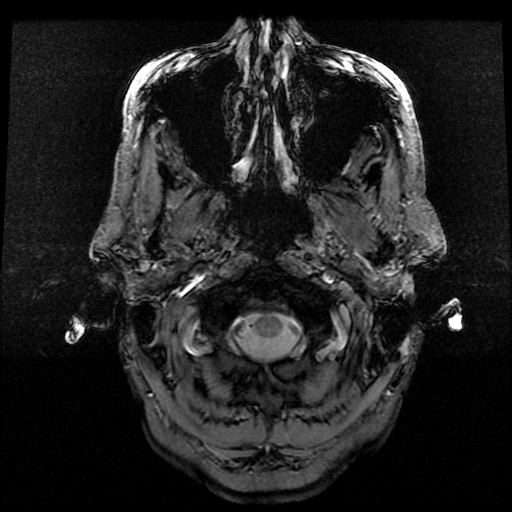

[Series 11: T2 · coronal · 5.0mm · 0.45mm/px · 3 of 27 slices shown (2 of 2)]
[im 1/27]
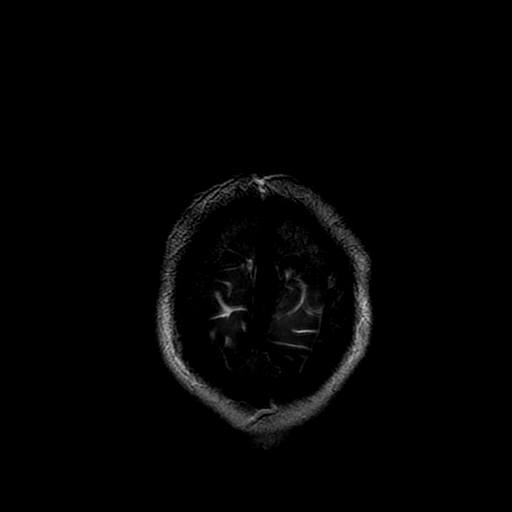
[im 14/27]
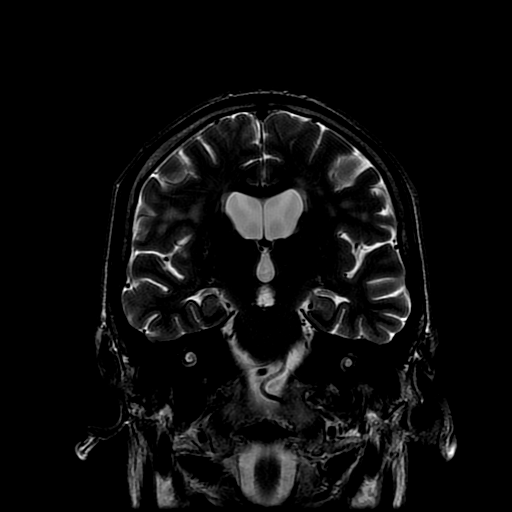
[im 27/27]
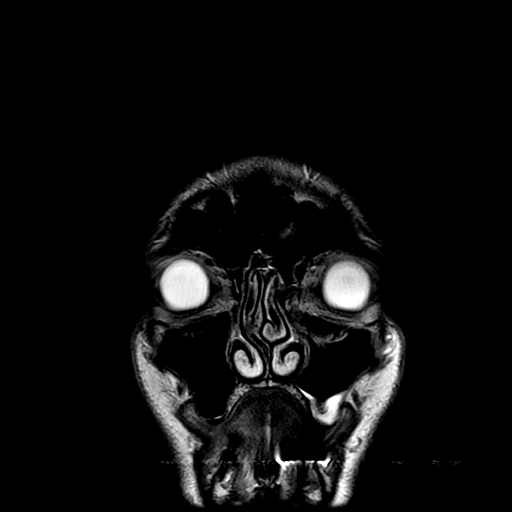

[Series 500: DWI · axial · 3.0mm · 1.09mm/px · z∈[+34,+187]mm · 6 of 54 slices shown (3 of 4)]
[im 1/54]
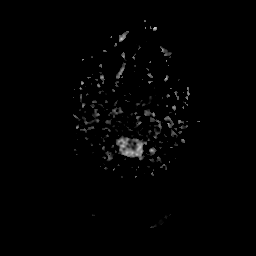
[im 11/54]
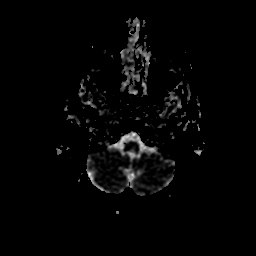
[im 22/54]
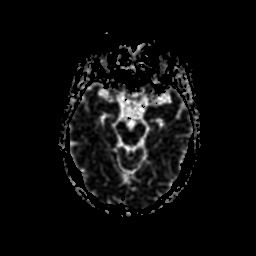
[im 32/54]
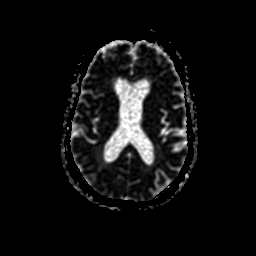
[im 43/54]
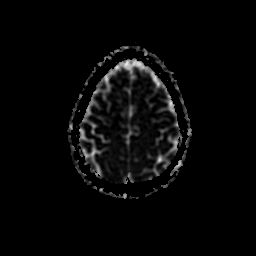
[im 54/54]
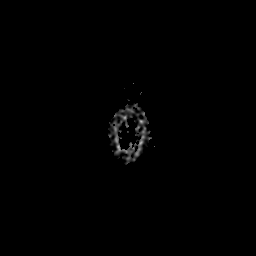

[Series 600: DWI · coronal · 5.0mm · 1.09mm/px · 4 of 36 slices shown (4 of 4)]
[im 1/36]
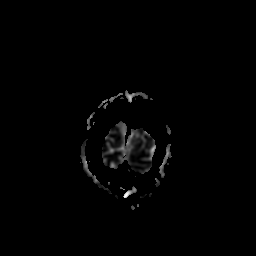
[im 12/36]
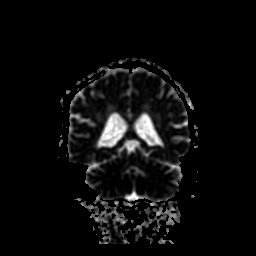
[im 24/36]
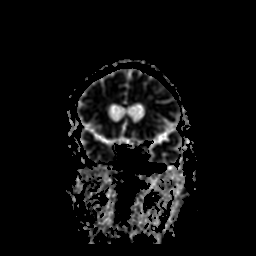
[im 36/36]
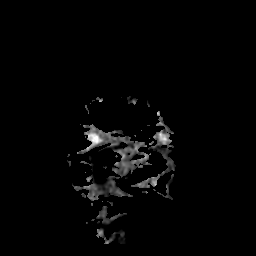

[38 of 48 positions shown; findings below may reference images not displayed]

FINDINGS: Mild ventricular enlargement due to atrophy.  Mild cerebral atrophy.

Negative for acute infarct.

Numerous hyperintense areas are present throughout the cerebral
white matter bilaterally. Hyperintense lesions in the
periventricular and deep white matter diffusely. Basal ganglia and
brainstem normal.

Negative for intracranial hemorrhage

Negative for mass or edema.  No shift of the midline structures.

Circle of Willis patent.

Paranasal sinuses clear. No orbital lesion. Bilateral cataract
surgery. Pituitary not enlarged.

Calvarium intact.
IMPRESSION: Atrophy and moderate chronic microvascular ischemic changes
throughout the white matter. No acute abnormality.

## 2020-06-28 DEATH — deceased
